# Patient Record
Sex: Female | Born: 1975 | Race: Black or African American | Hispanic: No | Smoking: Current every day smoker
Health system: Southern US, Community
[De-identification: ages and names within clinical notes are randomized; demographics above are authoritative.]

## PROBLEM LIST (undated history)

## (undated) DIAGNOSIS — I10 Essential (primary) hypertension: Secondary | ICD-10-CM

## (undated) DIAGNOSIS — F419 Anxiety disorder, unspecified: Secondary | ICD-10-CM

## (undated) DIAGNOSIS — E785 Hyperlipidemia, unspecified: Secondary | ICD-10-CM

---

## 2003-03-12 ENCOUNTER — Ambulatory Visit (HOSPITAL_COMMUNITY): Admission: RE | Admit: 2003-03-12 | Discharge: 2003-03-12 | Payer: Self-pay | Admitting: Family Medicine

## 2003-07-26 ENCOUNTER — Other Ambulatory Visit: Admission: RE | Admit: 2003-07-26 | Discharge: 2003-07-26 | Payer: Self-pay | Admitting: Family Medicine

## 2003-12-17 ENCOUNTER — Emergency Department (HOSPITAL_COMMUNITY): Admission: EM | Admit: 2003-12-17 | Discharge: 2003-12-17 | Payer: Self-pay | Admitting: Emergency Medicine

## 2003-12-21 ENCOUNTER — Emergency Department (HOSPITAL_COMMUNITY): Admission: EM | Admit: 2003-12-21 | Discharge: 2003-12-21 | Payer: Self-pay | Admitting: Emergency Medicine

## 2004-02-18 ENCOUNTER — Emergency Department (HOSPITAL_COMMUNITY): Admission: EM | Admit: 2004-02-18 | Discharge: 2004-02-18 | Payer: Self-pay | Admitting: Emergency Medicine

## 2004-10-02 ENCOUNTER — Other Ambulatory Visit: Admission: RE | Admit: 2004-10-02 | Discharge: 2004-10-02 | Payer: Self-pay | Admitting: Family Medicine

## 2005-03-25 ENCOUNTER — Inpatient Hospital Stay (HOSPITAL_COMMUNITY): Admission: EM | Admit: 2005-03-25 | Discharge: 2005-04-04 | Payer: Self-pay | Admitting: Emergency Medicine

## 2005-04-25 ENCOUNTER — Ambulatory Visit: Payer: Self-pay | Admitting: Internal Medicine

## 2005-05-07 ENCOUNTER — Ambulatory Visit: Payer: Self-pay | Admitting: Internal Medicine

## 2005-05-11 ENCOUNTER — Ambulatory Visit (HOSPITAL_COMMUNITY): Admission: RE | Admit: 2005-05-11 | Discharge: 2005-05-11 | Payer: Self-pay | Admitting: Internal Medicine

## 2005-05-15 ENCOUNTER — Ambulatory Visit: Payer: Self-pay | Admitting: Internal Medicine

## 2005-05-31 ENCOUNTER — Ambulatory Visit: Payer: Self-pay | Admitting: *Deleted

## 2005-06-19 ENCOUNTER — Ambulatory Visit: Payer: Self-pay | Admitting: Internal Medicine

## 2006-01-09 ENCOUNTER — Ambulatory Visit: Payer: Self-pay | Admitting: Internal Medicine

## 2006-01-21 ENCOUNTER — Ambulatory Visit: Payer: Self-pay | Admitting: Internal Medicine

## 2006-01-21 ENCOUNTER — Encounter (INDEPENDENT_AMBULATORY_CARE_PROVIDER_SITE_OTHER): Payer: Self-pay | Admitting: *Deleted

## 2006-11-22 ENCOUNTER — Other Ambulatory Visit: Admission: RE | Admit: 2006-11-22 | Discharge: 2006-11-22 | Payer: Self-pay | Admitting: Family Medicine

## 2007-03-20 IMAGING — CT CT PELVIS W/ CM
2 of 6 series · 16 of 46 positions shown, 18 images · IV contrast (omnipaque)
Comparison: 03/25/05.

CLINICAL DATA: Pancreatitis.  Perisplenic fluid present on ultrasound of 03/28/05 and also to small degree on the initial CT scan dated 03/24/05. 
 ABDOMEN CT WITH CONTRAST:
TECHNIQUE: Multidetector CT imaging of the abdomen was performed following the standard protocol during bolus administration of intravenous contrast.
 Contrast:  125 cc Omnipaque 300.  Oral and IV contrast were administered.
TECHNIQUE: Multidetector CT imaging of the pelvis was performed following the standard protocol during bolus administration of intravenous contrast.

[Series 2: abd_pel 5.0 b40f st · axial · 0.63mm/px · z∈[-110,+275]mm · 13 of 87 slices shown, 15 images]
[im 5/87  soft-tissue]
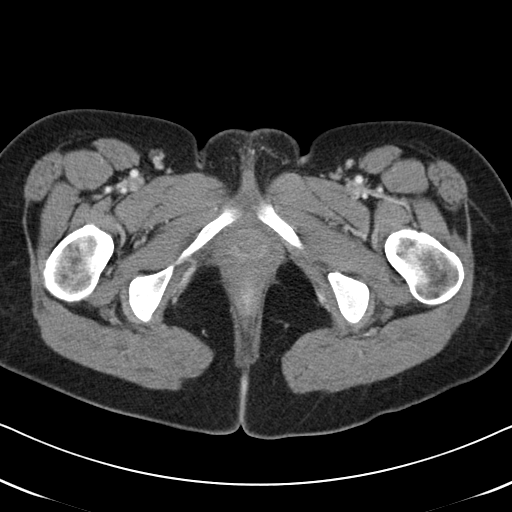
[im 5/87  bone]
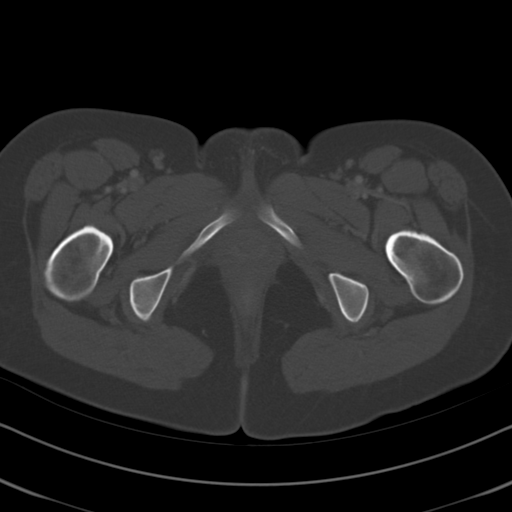
[im 10/87  soft-tissue]
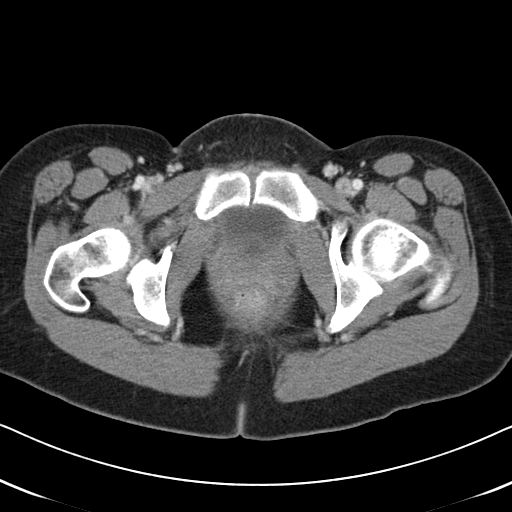
[im 20/87  soft-tissue]
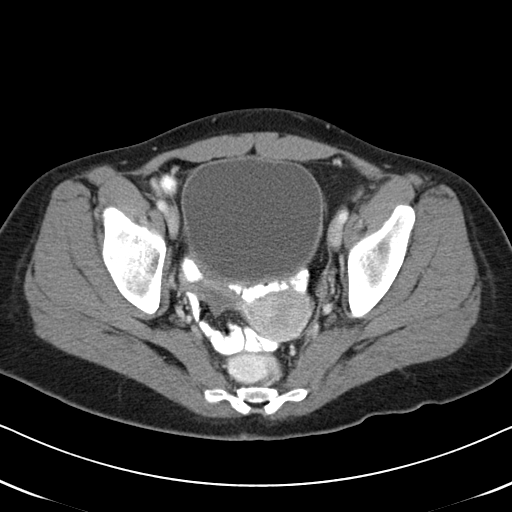
[im 24/87  soft-tissue]
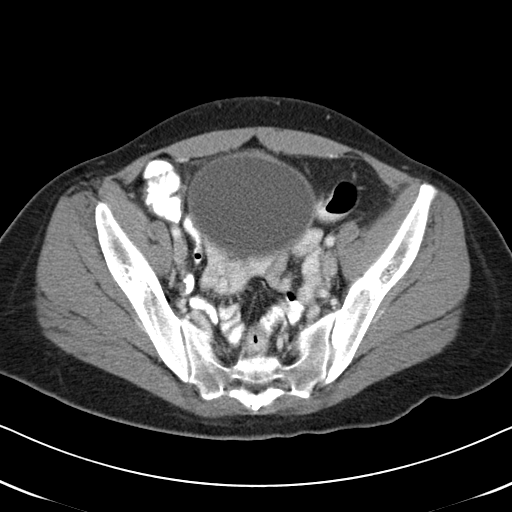
[im 29/87  soft-tissue]
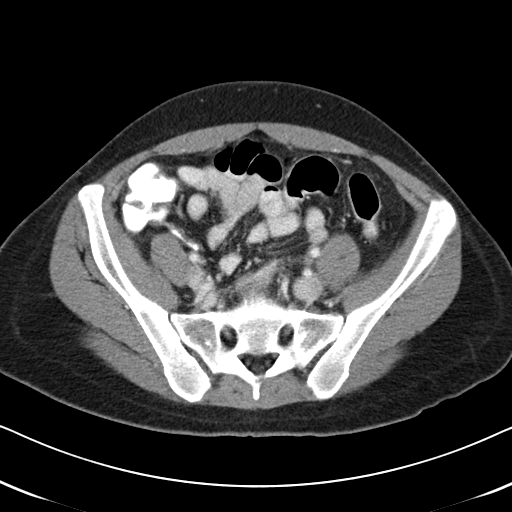
[im 39/87  soft-tissue]
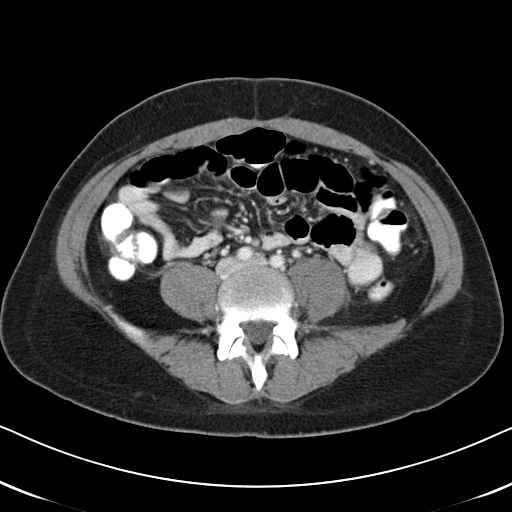
[im 44/87  soft-tissue]
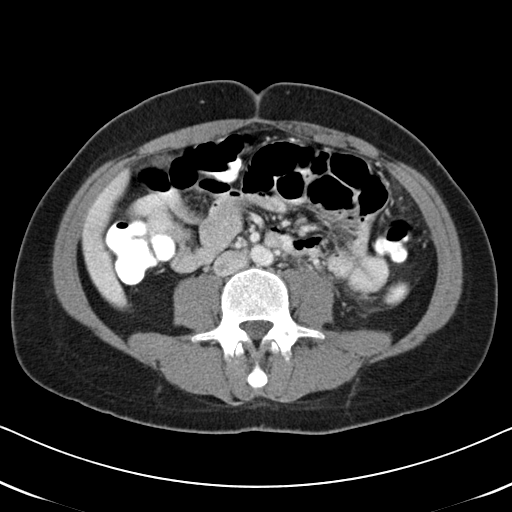
[im 48/87  soft-tissue]
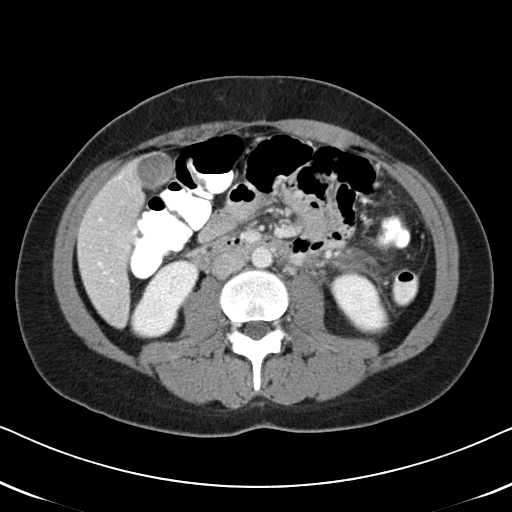
[im 58/87  soft-tissue]
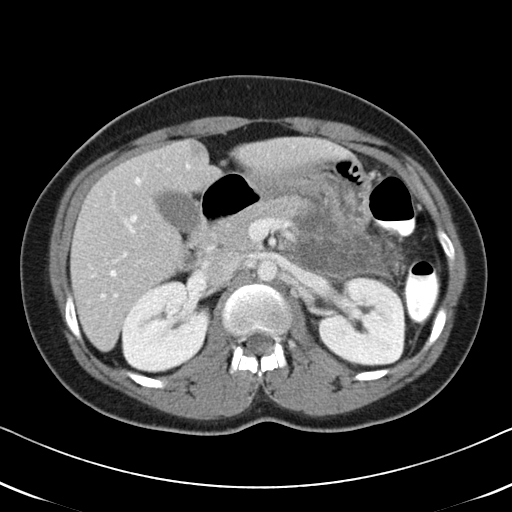
[im 58/87  bone]
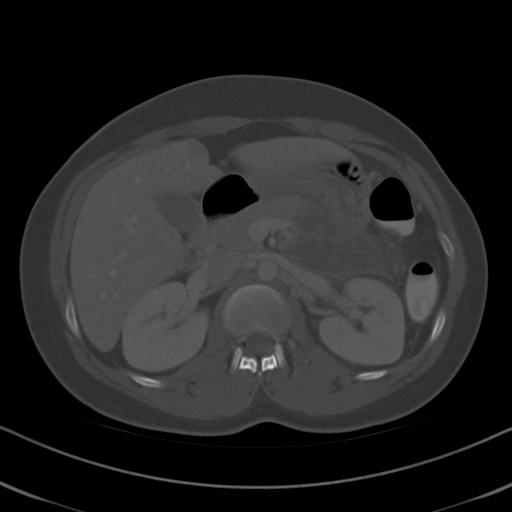
[im 63/87  soft-tissue]
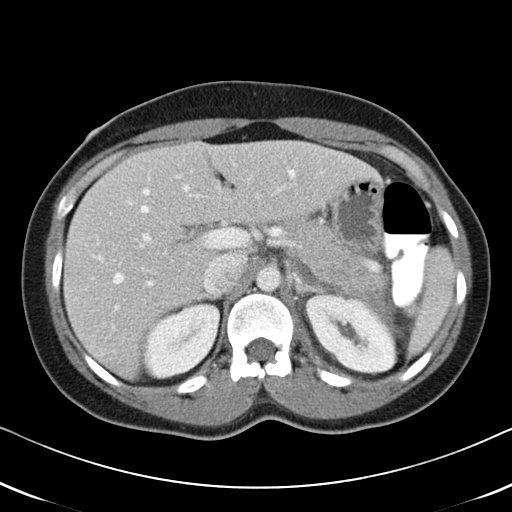
[im 67/87  soft-tissue]
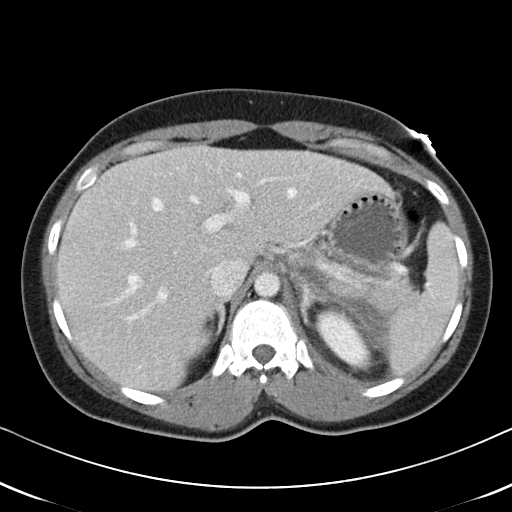
[im 77/87  soft-tissue]
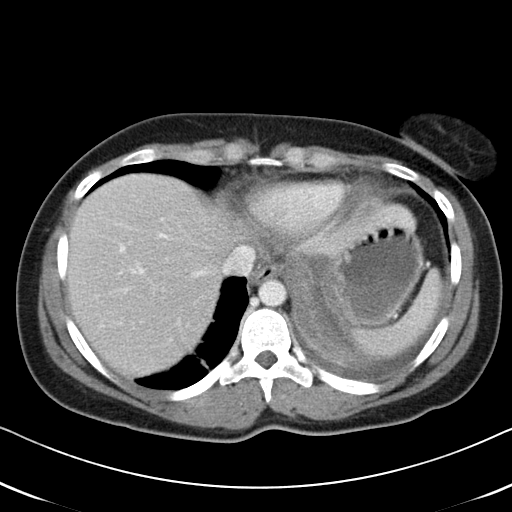
[im 82/87  soft-tissue]
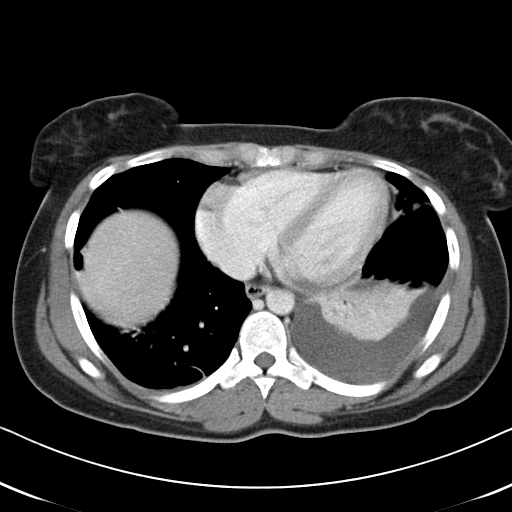

[Series 602: coronal · coronal · 0.88mm/px · 3 of 39 slices shown]
[im 13/39  soft-tissue]
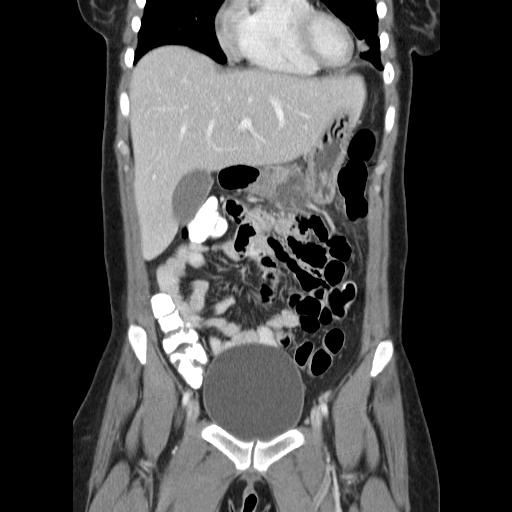
[im 17/39  soft-tissue]
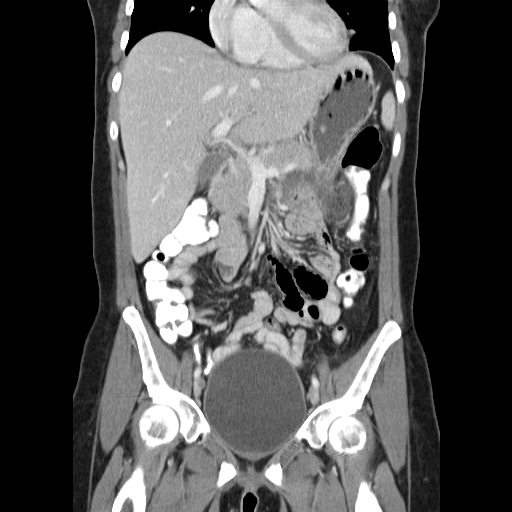
[im 22/39  soft-tissue]
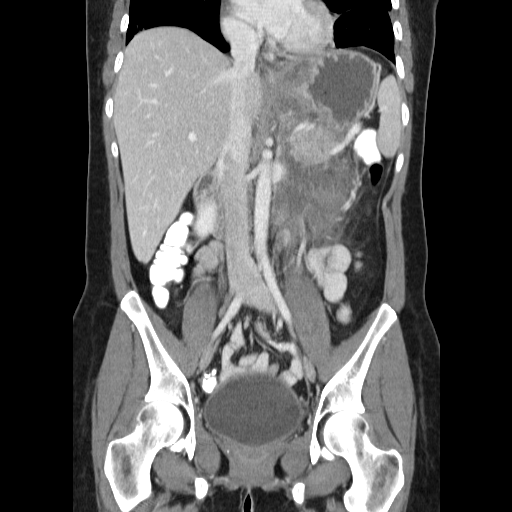

[16 of 46 positions shown; findings below may reference images not displayed]

FINDINGS: There is progression of pancreatitis with substantial inflammatory changes present beginning in the subphrenic space on the left and continuing to abut the entire tail and distal body of the pancreas.  Small loculations of peripancreatic fluid are present in this region and both inflammatory changes and small loculations of fluid continue over several centimeters inferior to the spleen in the retroperitoneal space.  The pancreas also demonstrates progressive diffuse edema since the prior CT beginning at the level of the proximal body and continuing throughout the tail.  A significant component of pancreatic necrosis is not present although there are some areas of low attenuation within the distal pancreas which may represent small focal areas of necrosis.  At this time none of the loculations of inflammatory fluid show evidence of abscess formation by CT.  
 No associated venous thrombosis is identified with normal patency demonstrated of the splenic vein and portal vein.  Mesenteric veins are also patent.  There is a new small left pleural effusion at the lung base with associated left lower lobe atelectasis.  
 No bowel obstruction.  No free air.  The liver, spleen, gallbladder adrenal glands, and kidneys continue to have a normal appearance.
IMPRESSION: Progression of pancreatitis with severe inflammatory changes seen surrounding the body and tail of the pancreas.  Extensive inflammatory change and small loculations of inflammatory fluid extend from the subphrenic space on the left and well inferior to the pancreas.  At this time no definite focal abscess is seen by CT.  There are some patchy areas of decreased enhancement in the tail of the pancreas potentially representing small areas of necrosis.  
 PELVIS CT WITH CONTRAST:
FINDINGS: No significant free fluid is seen in the pelvis.  Bladder, pelvic bowel loops, uterus, and adnexal regions are unremarkable.  No hernias.
IMPRESSION: Normal CT of the pelvis.

## 2007-11-28 ENCOUNTER — Other Ambulatory Visit: Admission: RE | Admit: 2007-11-28 | Discharge: 2007-11-28 | Payer: Self-pay | Admitting: Family Medicine

## 2009-03-16 ENCOUNTER — Other Ambulatory Visit: Admission: RE | Admit: 2009-03-16 | Discharge: 2009-03-16 | Payer: Self-pay | Admitting: Family Medicine

## 2010-05-24 ENCOUNTER — Other Ambulatory Visit (HOSPITAL_COMMUNITY)
Admission: RE | Admit: 2010-05-24 | Discharge: 2010-05-24 | Disposition: A | Discharge status: Home / Self Care | Payer: BC Managed Care – PPO | Source: Ambulatory Visit | Attending: Family Medicine | Admitting: Family Medicine

## 2010-05-24 ENCOUNTER — Other Ambulatory Visit: Payer: Self-pay | Admitting: Physician Assistant

## 2010-05-24 DIAGNOSIS — Z1159 Encounter for screening for other viral diseases: Secondary | ICD-10-CM | POA: Insufficient documentation

## 2010-05-24 DIAGNOSIS — Z0000 Encounter for general adult medical examination without abnormal findings: Secondary | ICD-10-CM | POA: Insufficient documentation

## 2010-06-30 NOTE — Consult Note (Signed)
[name], [name]                 ACCOUNT NO.:  [id number]   MEDICAL RECORD NO.:  [id number]          PATIENT TYPE:  INP   LOCATION:  1407                         FACILITY:  [facility]   PHYSICIAN:  Lebron Conners, M.D.   DATE OF BIRTH:  09/13/1975   DATE OF CONSULTATION:  03/25/2005  DATE OF DISCHARGE:                                   CONSULTATION   CHIEF COMPLAINT:  Abdominal pain.   HISTORY OF PRESENT ILLNESS:  [name] is a 35 year old black female who  enjoys good health who drinks alcohol fairly regularly and who is admitted  to the hospital because of abdominal pain which is also present in the back  and left shoulder. There was some nausea without vomiting. She felt she  needed to have a bowel movement and took several laxatives and had a lot of  diarrhea. She did not vomit. She has had a little bit of a cough and pain  with a deep breath. In the end, white count was a little elevated, lipase  was slightly elevated. There was a normal amylase,  bowel gas pattern was  unremarkable on x-ray and the patient had hyponatremia which was rather  marked with sodium of 116. A CT scan of the abdomen showed retrogastric and  left subphrenic inflammatory process without abscess or evidence of  perforation. This was thought possibly to be related to the tail of the  pancreas but the entire pancreas itself was not edematous or otherwise  normal. The patient was admitted to the hospital care of this problem and  has continued to have some pain and although she is hungry not feeling a  great deal better than when admitted. I am asked to see her to assist in  evaluation of this problem and to give advice regarding its management.   PAST MEDICAL HISTORY:  No serious chronic ailments. She is on birth control  pills but no other medicines and has no medicine allergies. She smokes  moderately.   Family history, childhood illnesses and detailed review of systems are  unremarkable.   PHYSICAL  EXAMINATION:  GENERAL:  A healthy appearing young black woman in no  acute distress.  VITAL SIGNS:  As reported on the computer are normal.  MENTAL STATUS:  Normal.  HEENT:  The head, neck, eyes, ears, nose, mouth and throat are unremarkable  with no cervical lymphadenopathy, no enlargement of the thyroid, no thyroid  mass. Moist mucous membranes. No scleral icterus.  CHEST:  The chest is clear to auscultation but there is complaint of pain  with a deep breath. There is left CVA tenderness to percussion.  HEART:  The heart rate is rapid, rhythm is regular and there is no murmur or  gallop noted.  BREASTS:  Symmetric and normal without masses.  ABDOMEN:  There is mild diffuse tenderness greatest in the left upper  quadrant. Bowel sounds are somewhat decreased. There is no hernia. There is  no mass.  EXTREMITIES:  No edema. Good pulses. No deformities or other lesions.  SKIN:  No lesions are noted.  LYMPH NODES:  Not enlarged in the axilla or neck.   IMPRESSION:  Abdominal pain and inflammatory process of uncertain etiology,  most likely mild pancreatitis. I feel that in the absence of history of  trauma, of vomiting and chronic epigastric pain that ulcer disease and  traumatic bleeding are quite unlikely. It is unlikely that biliary disease  is playing a part. The pancreatitis is most likely related to alcohol  intake.   RECOMMENDATIONS:  1.  Treatment of metabolic abnormalities.  2.  Bowel rest and provision of intravenous fluids.  3.  Repeat amylase and lipase.  4.  Serial physical examinations.  5.  After several days, I think she should have a repeat CT scan of the      abdomen.  6.  I see no reason to advise surgical intervention at this time.      Lebron Conners, M.D.  Electronically Signed     WB/MEDQ  D:  03/25/2005  T:  03/26/2005  Job:  [id number]   cc:   Sherin Quarry, MD

## 2010-06-30 NOTE — H&P (Signed)
NAMEVALARIA, KOHUT                 ACCOUNT NO.:  [id number]   MEDICAL RECORD NO.:  [id number]          PATIENT TYPE:  EMS   LOCATION:  ED                           [facility]ACILITY[facility]   PHYSICIAN:  Kela Millin, M.D.DATE OF BIRTH:  02-19-1975   DATE OF ADMISSION:  03/24/2005  DATE OF DISCHARGE:                                HISTORY & PHYSICAL   PRIMARY CARE PHYSICIAN:  Media planner, [employer]   CHIEF COMPLAINT:  Abdominal pain.   HISTORY OF PRESENT ILLNESS:  The patient is a 35 year old black female with  a history of alcohol use who presents with complaints of abdominal pain x2  days.  She states that the pain is all over her abdomen but greatest on the  left side, 9/10 in intensity, and radiating to her back and scapula.  It is  sharp, initially intermittent, but constant all day today.  She states that  for about 3 days she was constipated and thought that was what was causing  her abdominal pain, so today she took a bottle of magnesium citrate and had  multiple bowel movements following that, but her pain persisted.  She denies  nausea, vomiting, hematemesis, dysuria, melena, and no hematochezia.  She  admits to a nonproductive cough and pleuritic pain for the past few days but  denies fevers.   The patient was seen in the ER, and chest and abdominal films showed mild  bronchitic changes with nonspecific bowel gas pattern.  Her labs revealed  low sodium of 116 and elevated white cell count of 13.8.  A CT scan of the  abdomen was also abnormal, and the patient was admitted to the [facility] service for further evaluation and management.   PAST MEDICAL HISTORY:  As above.   MEDICATIONS:  Birth control pills.   ALLERGIES:  No known drug allergies.   SOCIAL HISTORY:  Positive for alcohol; she states that she drinks about 4 to  6 beers a couple of times a week as well as wine sometimes.  Positive for  tobacco x10 years.   FAMILY HISTORY:  Mother with  hypertension.   PHYSICAL EXAMINATION:  GENERAL: The patient is a young black female, alert  and oriented, appears anxious and uncomfortable secondary to abdominal pain.  VITAL SIGNS: Blood pressure 123/91, initially 128/101.  Temperature 97.8,  pulse 113, initially 129.  Respiratory rate 20, and O2 saturation 97%.  HEENT: PERRLA.  EOMI.  Dry mucous membranes, no oral exudates.  NECK:  Supple.  No adenopathy, no thyromegaly.  LUNGS: Clear to auscultation bilaterally. No crackles or wheezes.  CARDIOVASCULAR: Tachycardic, normal S1 and S2, regular rhythm.  ABDOMEN: Diffusely tenderness with greater tenderness in the left upper  quadrant.  No rebound tenderness.  No organomegaly and no masses palpable.  Bowel sounds present, mildly distended.  EXTREMITIES: No cyanosis or edema.  NEUROLOGIC:  Alert and oriented x3.  Cranial nerves II-XII grossly intact.  Nonfocal exam.   LABORATORY DATA:  CT scan of the abdomen shows left diaphragmatic  inflammatory process, question marked pancreatitis of pancreatic tail. Also  observed changes continuous with posterior wall of the stomach and extends  to the gastroesophageal junction.  No discrete gastric thickening seen.  Cannot exclude ulcer disease.   Abdominal and chest x-ray: Mild bronchitic changes, nonspecific bowel gas  pattern.   Urinalysis: Yellow, clear, specific gravity 1.030, pH of 5, urine nitrite  negative, leukocyte esterase negative.  Urine wbc's 0 to 2. White cell count  13.8, hemoglobin 14.7, hematocrit 41.8, platelet count 211, neutrophil count  85%.  Sodium 116, potassium 3.5, chloride 87, CO2 18, glucose 102, BUN 7,  creatinine 0.3, calcium 8.7.   ASSESSMENT AND PLAN:  1.  Abdominal pain, probable pancreatitis, abdominal CT also with      inflammatory changes continuous with posterior wall of stomach extending      to the gastroesophageal junction.  I cannot rule out ulcer disease.  As      already discussed above, the patient  presenting with predominantly left      upper quadrant pain.  History is positive for alcohol abuse and CT      findings as above.  I will check a serum amylase and lipase.  I will      keep the patient n.p.o., hydrate with IV fluids, IV narcotics for pain      management, also start IV Protonix.  The patient counseled to quit      alcohol.  2.  Hyponatremia, likely secondary to volume depletion (a patient with      multiple fall status post magnesium citrate).  Also, a patient with      history of alcohol abuse and potomania as possible etiology as well.  I      will check TSH and serum cortisol as well as urine sodium and      osmolality.  I will hydrate with normal saline, monitor serum sodium q.4      h. and follow.  Patient with no nausea, vomiting, or altered mental      status.  3.  Alcohol abuse: I will start IV multivitamins, thiamine, and Ativan,      monitor with withdrawal.  Patient counseled to quit alcohol.  4.  Tobacco abuse.      Kela Millin, M.D.  Electronically Signed     ACV/MEDQ  D:  03/25/2005  T:  03/25/2005  Job:  [id number]   cc:   [facility], [facility]

## 2010-06-30 NOTE — Discharge Summary (Signed)
NAMELUREE, [name]                 ACCOUNT NO.:  [id number]   MEDICAL RECORD NO.:  [id number]          PATIENT TYPE:  INP   LOCATION:  1407                         FACILITY:  [facility]   PHYSICIAN:  Hollice Espy, M.D.DATE OF BIRTH:  [birth year]   DATE OF ADMISSION:  03/24/2005  DATE OF DISCHARGE:  04/04/2005                                 DISCHARGE SUMMARY   CONSULTATIONS:  Lebron Conners, M.D., surgery.   DISCHARGE DIAGNOSES:  1.  Acute pancreatitis, now resolved.  2.  Excessive alcohol use causing #1.   DISCHARGE MEDICATIONS:  Patient was to resume her normal meds-Birth control.  She will also be discharged on Dilaudid 2 mg 1 p.o. q.4-6h., total #10.   FOLLOW-UP APPOINTMENTS:  Patient will follow up as needed with [facility].  She has also been given contact information for alcohol and drug  services.   DISCHARGE DIET:  Regular.   ACTIVITY:  As tolerated.   DISPOSITION:  Improved.  Patient is being discharged to home.   HOSPITAL COURSE:  The patient is a 35 year old African-American female with  no past medical history, who presented on March 25, 2005 after several  days of nausea, vomiting, and mid epigastric pain.  She was suspected to  have pancreatitis.  Her initial amylase and lipase were not very remarkable;  however, following administration of IV fluids and made n.p.o., she actually  increased her amylase and lipase.  This persisted over several days, and she  actually ended up requiring TPN.  Surgery was consulted, who recommended  that no evidence of any other intervention was necessary.  Initially, there  was a question as to the cause of the etiology of the patient's  pancreatitis.  She admitted to some alcohol use but felt it was not  excessive; however, she later confided in her physicians that she had been  at times known to drink heavily, or binge drink.  CT scan showed no evidence  of any gallbladder dysfunction.  A lipid panel was ordered  which showed no  evidence of any hypertriglyceridemia.  At this point, it was felt that the  alcohol use was the sole cause of her pancreatitis.  The patient over the  next several days continued to improve.  She was started on a clear-liquid  diet on April 02, 2005, which she tolerated well.  This was advanced very  slowly to a full-liquid diet, and by February 21, she was tolerating a  regular solid food diet, at which point she is felt to be medically stable.  She had a few episodes of minimal abdominal pain but nothing, she said, that  she could not tolerate.   The plan will be for the patient to be discharged home and follow up.  She  currently has no insurance.  She will be given a referral for [facility].      Hollice Espy, M.D.  Electronically Signed     SKK/MEDQ  D:  04/04/2005  T:  04/05/2005  Job:  [id number]

## 2011-02-28 ENCOUNTER — Emergency Department (HOSPITAL_COMMUNITY)
Admission: EM | Admit: 2011-02-28 | Discharge: 2011-03-01 | Disposition: A | Discharge status: Home / Self Care | Payer: BC Managed Care – PPO | Attending: Emergency Medicine | Admitting: Emergency Medicine

## 2011-02-28 ENCOUNTER — Encounter (HOSPITAL_COMMUNITY): Payer: Self-pay

## 2011-02-28 ENCOUNTER — Emergency Department (HOSPITAL_COMMUNITY): Payer: BC Managed Care – PPO

## 2011-02-28 ENCOUNTER — Other Ambulatory Visit: Payer: Self-pay

## 2011-02-28 DIAGNOSIS — R079 Chest pain, unspecified: Secondary | ICD-10-CM | POA: Insufficient documentation

## 2011-02-28 DIAGNOSIS — R000 Tachycardia, unspecified: Secondary | ICD-10-CM | POA: Insufficient documentation

## 2011-02-28 HISTORY — DX: Anxiety disorder, unspecified: F41.9

## 2011-02-28 LAB — CBC
HCT: 39.6 % (ref 36.0–46.0)
Hemoglobin: 13.8 g/dL (ref 12.0–15.0)
MCH: 31.5 pg (ref 26.0–34.0)
MCHC: 34.8 g/dL (ref 30.0–36.0)
MCV: 90.4 fL (ref 78.0–100.0)
Platelets: 234 10*3/uL (ref 150–400)
RBC: 4.38 MIL/uL (ref 3.87–5.11)
RDW: 12.5 % (ref 11.5–15.5)
WBC: 6 10*3/uL (ref 4.0–10.5)

## 2011-02-28 LAB — POCT I-STAT TROPONIN I: Troponin i, poc: 0 ng/mL (ref 0.00–0.08)

## 2011-02-28 LAB — BASIC METABOLIC PANEL
BUN: 12 mg/dL (ref 6–23)
CO2: 21 mEq/L (ref 19–32)
Calcium: 9.4 mg/dL (ref 8.4–10.5)
Chloride: 106 mEq/L (ref 96–112)
Creatinine, Ser: 0.62 mg/dL (ref 0.50–1.10)
GFR calc Af Amer: >90 mL/min (ref >90)
GFR calc non Af Amer: >90 mL/min (ref >90)
Glucose, Bld: 120 mg/dL — ABNORMAL HIGH (ref 70–99)
Potassium: 3.9 mEq/L (ref 3.5–5.1)
Sodium: 137 mEq/L (ref 135–145)

## 2011-02-28 LAB — D-DIMER, QUANTITATIVE: D-Dimer, Quant: 0.28 ug/mL-FEU (ref 0.00–0.48)

## 2011-02-28 MED ORDER — ASPIRIN 81 MG PO CHEW
324.0000 mg | CHEWABLE_TABLET | Freq: Once | ORAL | Status: AC
  Administered 2011-02-28: 324 mg via ORAL
  Filled 2011-02-28: qty 4

## 2011-02-28 NOTE — ED Notes (Signed)
Pt complains of chest tightness, left arm pain and back pain for one hour

## 2011-02-28 NOTE — ED Notes (Signed)
Patient transported to X-ray 

## 2011-02-28 NOTE — ED Notes (Signed)
Pt is C/O substernal chest pressure with irregular heart rate. Pt states pressure radiates to left arm Denies SOB, N&V.

## 2011-03-01 NOTE — ED Provider Notes (Signed)
History    36 year-old female with chest pain. Patient with tightness in her chest with radiation up into her left arm. Constant since onset. Feels like her heart is skipping beats. No shortness of breath. No back pain. Does not feel nauseated. No unusual leg pain or swelling. Denies history of blood clots. Her recent surgery or prolonged immobilization. No history of cancer. Patient denies history of hypertension or diabetes. She does smoke. Takes diazepam as needed for anxiety otherwise no medications. CSN: [id number]  Arrival date & time 02/28/11  2147   First MD Initiated Contact with Patient 02/28/11 2225      Chief Complaint  Patient presents with  . Chest Pain  . Palpitations    (Consider location/radiation/quality/duration/timing/severity/associated sxs/prior treatment) HPI  Past Medical History  Diagnosis Date  . Anxiety     History reviewed. No pertinent past surgical history.  History reviewed. No pertinent family history.  History  Substance Use Topics  . Smoking status: Not on file  . Smokeless tobacco: Not on file  . Alcohol Use: No    OB History    Grav Para Term Preterm Abortions TAB SAB Ect Mult Living                  Review of Systems   Review of symptoms negative unless otherwise noted in HPI.   Allergies  Review of patient's allergies indicates no known allergies.  Home Medications   Current Outpatient Rx  Name Route Sig Dispense Refill  . DIAZEPAM 10 MG PO TABS Oral Take 5 mg by mouth every 6 (six) hours as needed.       BP 147/91  Pulse 102  Temp(Src) 98.4 F (36.9 C) (Oral)  Resp 20  SpO2 99%  Physical Exam  Nursing note and vitals reviewed. Constitutional: She appears well-developed and well-nourished. No distress.       Sitting up in bed. No acute distress. Mildly anxious appearing.  HENT:  Head: Normocephalic and atraumatic.  Eyes: Conjunctivae are normal. Pupils are equal, round, and reactive to light. Right eye exhibits  no discharge. Left eye exhibits no discharge.  Neck: Normal range of motion. Neck supple.  Cardiovascular: Regular rhythm and normal heart sounds.  Exam reveals no gallop and no friction rub.   No murmur heard.      Mild tachycardia. No murmur.  Pulmonary/Chest: Effort normal and breath sounds normal. No respiratory distress.  Abdominal: Soft. She exhibits no distension. There is no tenderness.  Musculoskeletal: Normal range of motion. She exhibits no edema and no tenderness.       Chest and shoulder pain not reproducible. No overlying skin lesions.  Neurological: She is alert.  Skin: Skin is warm and dry. She is not diaphoretic.  Psychiatric: Her behavior is normal. Thought content normal.      ED Course  Procedures (including critical care time)  Labs Reviewed  BASIC METABOLIC PANEL - Abnormal; Notable for the following:    Glucose, Bld 120 (*)    All other components within normal limits  CBC  D-DIMER, QUANTITATIVE  POCT I-STAT TROPONIN I  LAB REPORT - SCANNED  I-STAT TROPONIN I   Dg Chest 2 View  02/28/2011  *RADIOLOGY REPORT*  Clinical Data: Chest pain  CHEST - 2 VIEW  Comparison:  03/29/2005  Findings:  The heart size and mediastinal contours are within normal limits.  Both lungs are clear.  The visualized skeletal structures are unremarkable.  IMPRESSION: No active cardiopulmonary disease.  Original Report  Authenticated By: Judie Petit. Ruel Favors, M.D.   EKG:  Rhythm: normal sinus with PACs Rate: 88 Axis: normal Intervals: QTc slightly prolonged at 484 ms ST segments: normal    1. Chest pain       MDM  36 year-old female with chest pain and palpitations. Consider ACS,  PE, infectious, musculoskeletal, gastroesophageal reflux, pneumothorax, anxiety. Feel very low risk for ACS. Patient's EKG without ischemic changes. Troponin was within normal limits. Patient has no significant risk factors for CAD aside from smoking. Patient counseled on the harmful effects of tobacco  encouraged to quit. . Chest x-ray with no focal findings. Afebrile. Symptoms not typical for GERD. Low suspicion for PE patient also has a normal d-dimer. Suspect at least some component of anxiety. Although patient did not directly admitted to me she did feel more comfortable speaking with nursing concerning a loved one with some cognitive issues Strict return precautions discussed. Outpatient followup to discuss stress testing. Feel that this can be safely and properly done as an outpatient. Patient instructed that she should try to arrange this for the next few days to approximately a week.        Raeford Razor, MD 03/06/11 1505

## 2011-03-01 NOTE — ED Notes (Signed)
Pt was provided with a list of resources for Alzheimer care givers.

## 2012-07-24 ENCOUNTER — Other Ambulatory Visit: Payer: Self-pay | Admitting: Physician Assistant

## 2012-07-24 ENCOUNTER — Other Ambulatory Visit (HOSPITAL_COMMUNITY)
Admission: RE | Admit: 2012-07-24 | Discharge: 2012-07-24 | Disposition: A | Discharge status: Home / Self Care | Payer: BC Managed Care – PPO | Source: Ambulatory Visit | Attending: Family Medicine | Admitting: Family Medicine

## 2012-07-24 DIAGNOSIS — Z0000 Encounter for general adult medical examination without abnormal findings: Secondary | ICD-10-CM | POA: Insufficient documentation

## 2013-02-18 IMAGING — CR DG CHEST 2V
2 series · 2 of 2 positions shown · non-contrast
Comparison: 03/29/2005

CLINICAL DATA: Chest pain

CHEST - 2 VIEW

[w chest pa]
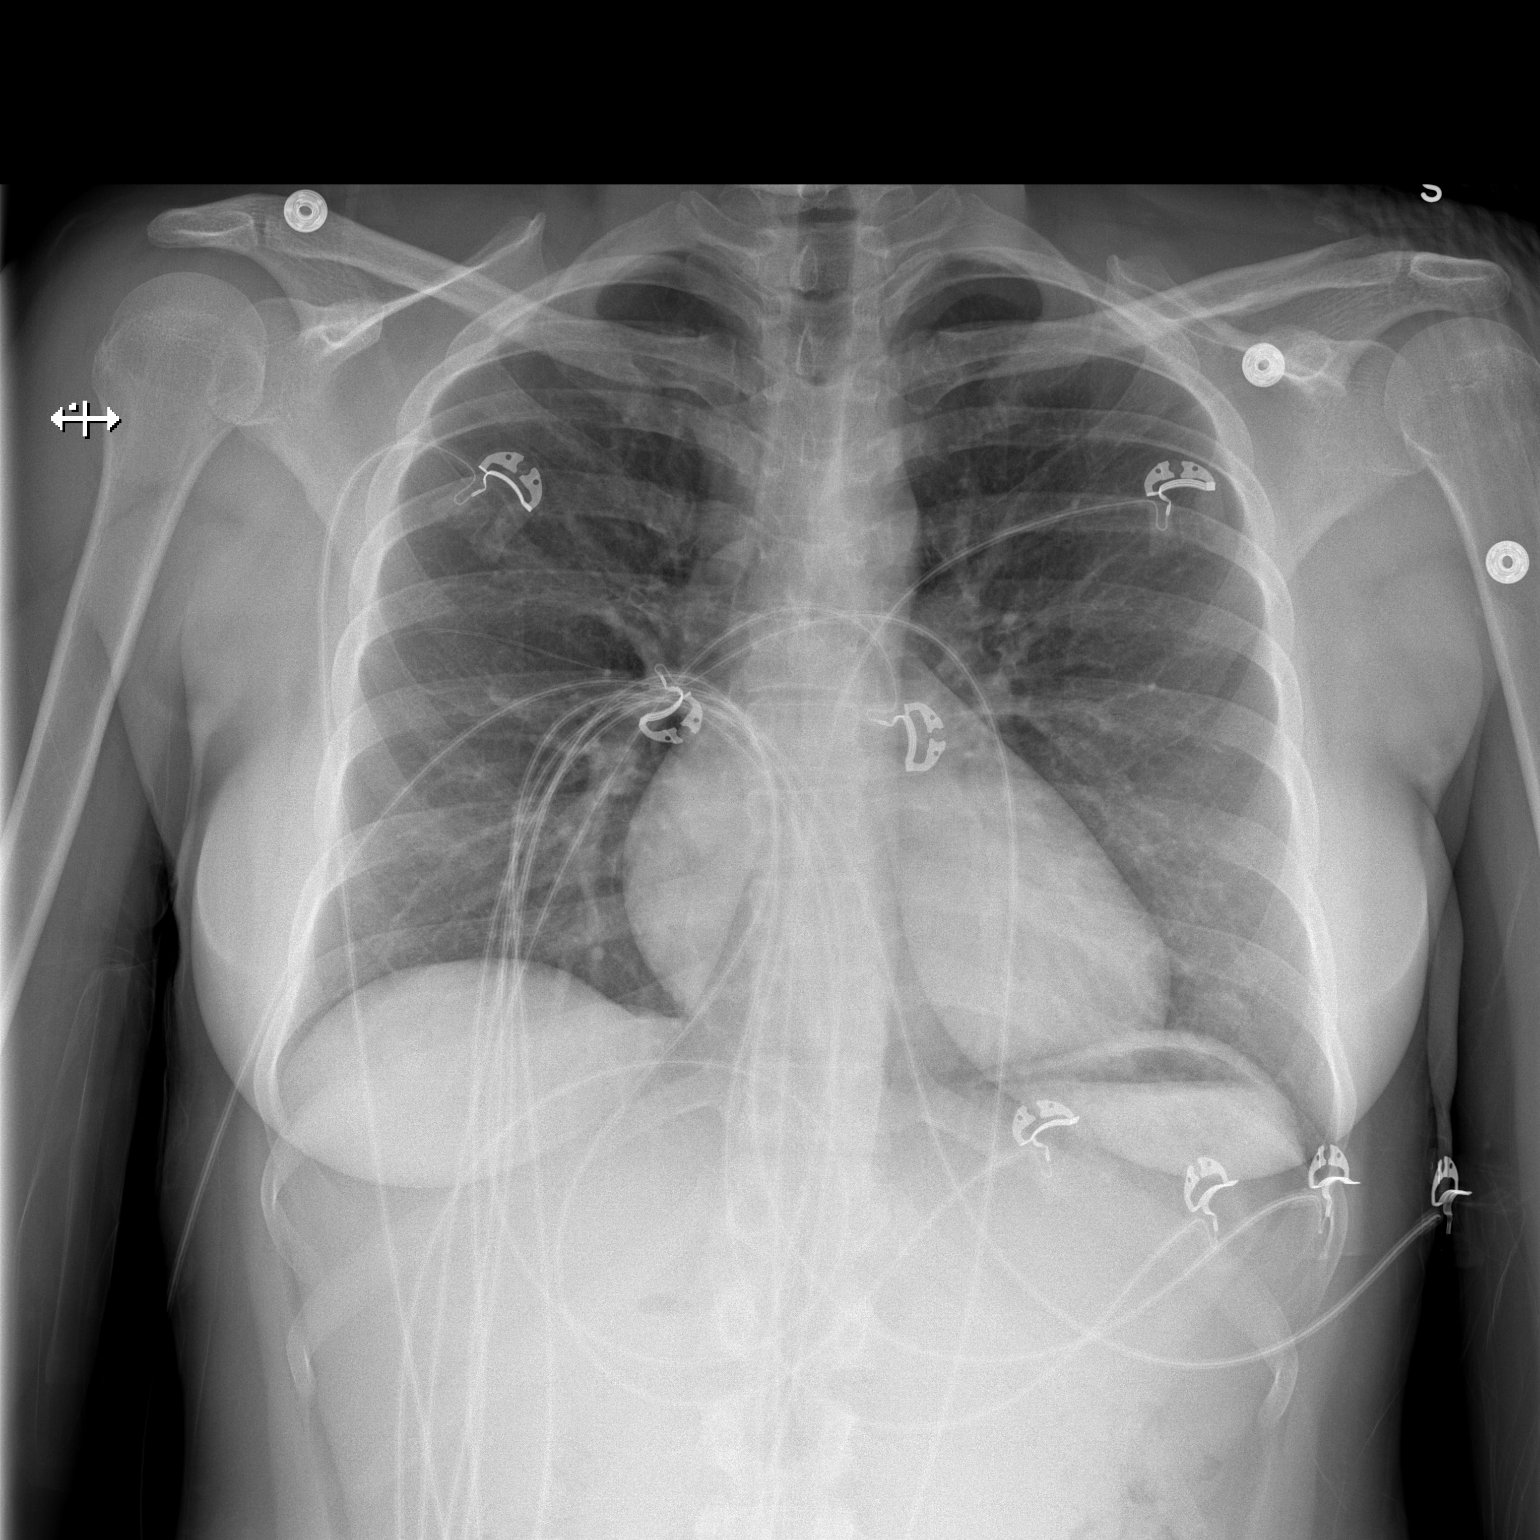

[w chest lat]
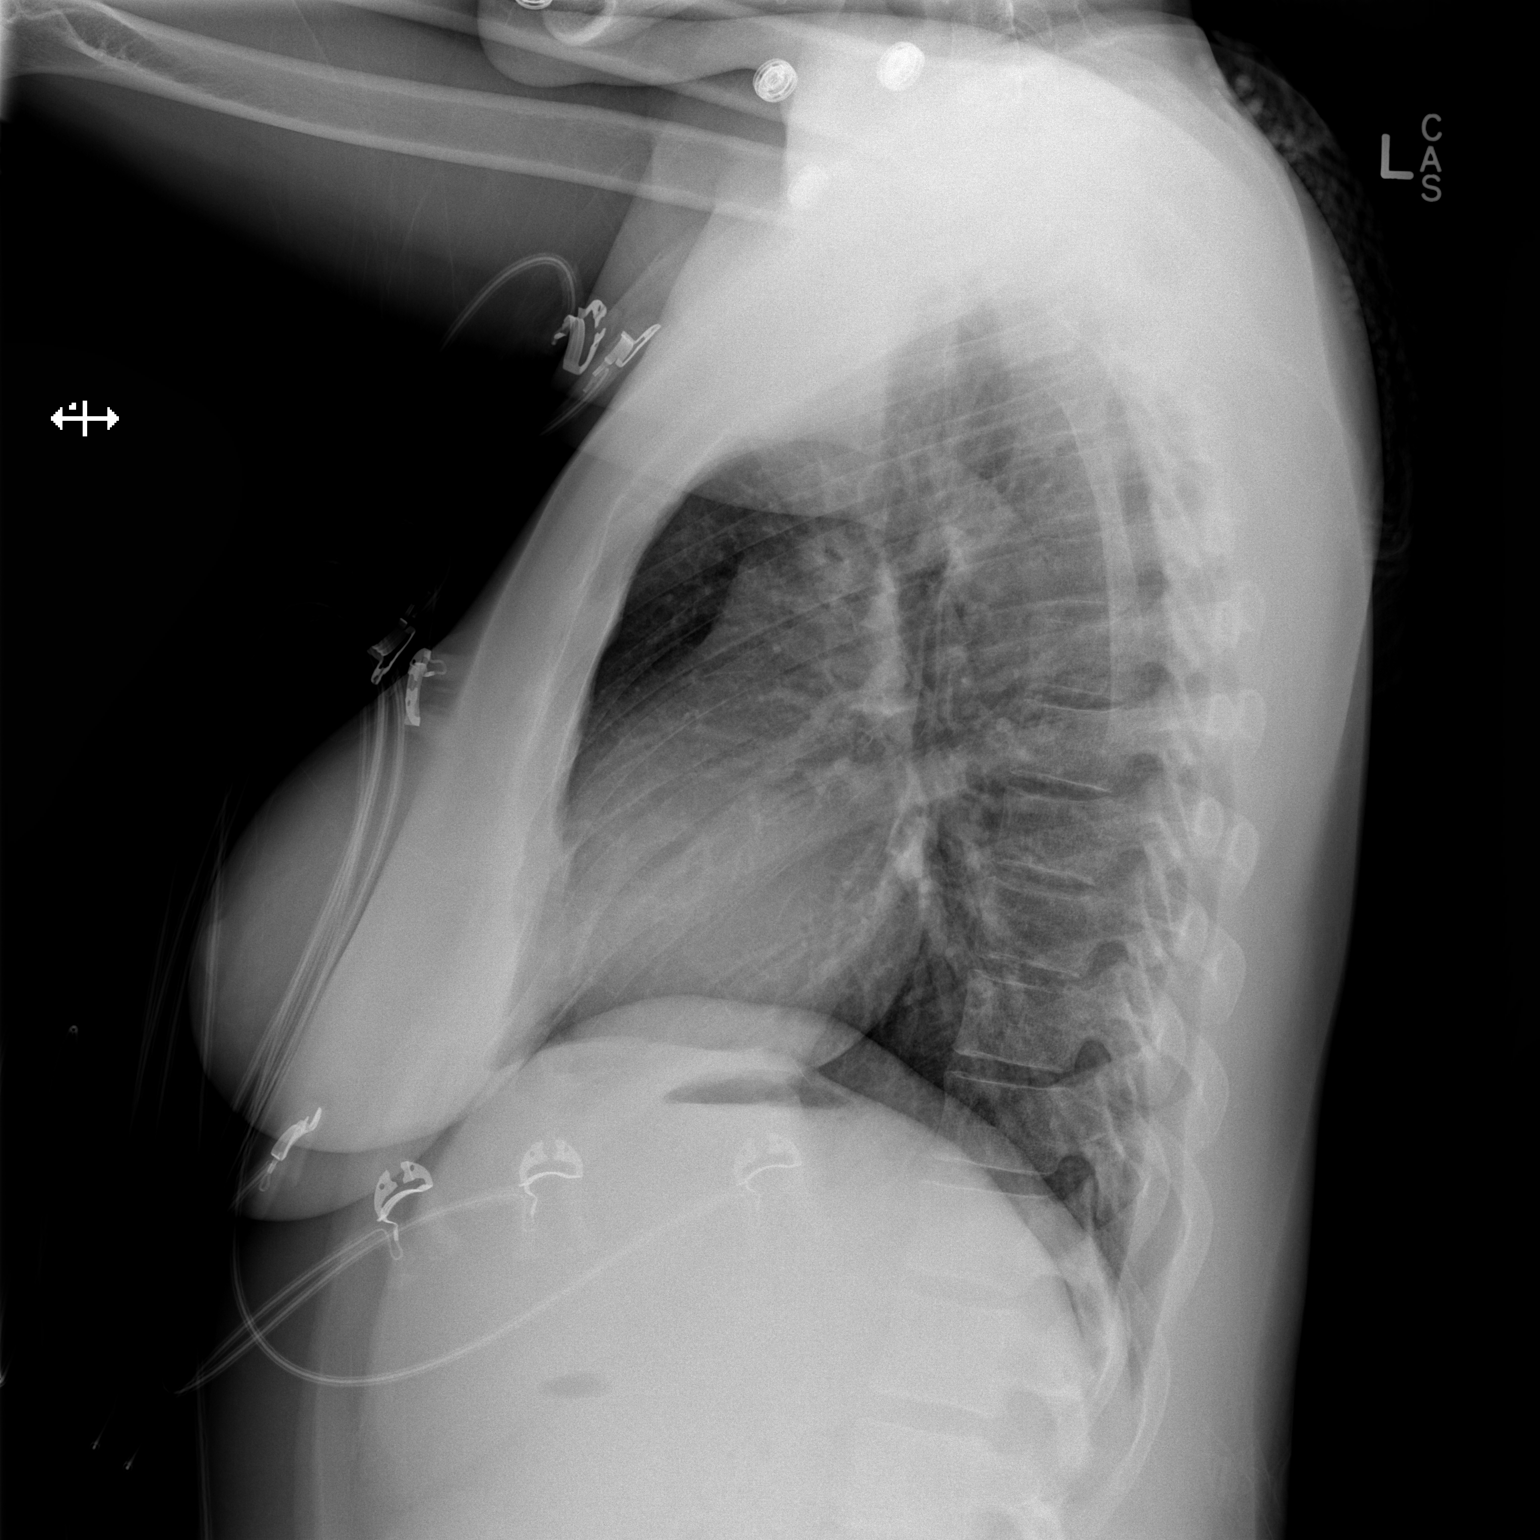

[2 of 2 positions shown; findings below may reference images not displayed]

FINDINGS: The heart size and mediastinal contours are within
normal limits.  Both lungs are clear.  The visualized skeletal
structures are unremarkable.
IMPRESSION: No active cardiopulmonary disease.

## 2018-12-02 ENCOUNTER — Other Ambulatory Visit: Payer: Self-pay

## 2018-12-02 ENCOUNTER — Encounter (HOSPITAL_COMMUNITY): Payer: Self-pay | Admitting: Emergency Medicine

## 2018-12-02 ENCOUNTER — Emergency Department (HOSPITAL_COMMUNITY)
Admission: EM | Admit: 2018-12-02 | Discharge: 2018-12-02 | Disposition: A | Discharge status: Home / Self Care | Payer: No Typology Code available for payment source | Attending: Emergency Medicine | Admitting: Emergency Medicine

## 2018-12-02 ENCOUNTER — Emergency Department (HOSPITAL_COMMUNITY): Payer: No Typology Code available for payment source

## 2018-12-02 DIAGNOSIS — R079 Chest pain, unspecified: Secondary | ICD-10-CM | POA: Insufficient documentation

## 2018-12-02 DIAGNOSIS — Z5321 Procedure and treatment not carried out due to patient leaving prior to being seen by health care provider: Secondary | ICD-10-CM | POA: Diagnosis not present

## 2018-12-02 LAB — BASIC METABOLIC PANEL
Anion gap: 13 (ref 5–15)
BUN: 10 mg/dL (ref 6–20)
CO2: 25 mmol/L (ref 22–32)
Calcium: 8.9 mg/dL (ref 8.9–10.3)
Chloride: 97 mmol/L — ABNORMAL LOW (ref 98–111)
Creatinine, Ser: 0.62 mg/dL (ref 0.44–1.00)
GFR calc Af Amer: >60 mL/min (ref >60)
GFR calc non Af Amer: >60 mL/min (ref >60)
Glucose, Bld: 95 mg/dL (ref 70–99)
Potassium: 3.4 mmol/L — ABNORMAL LOW (ref 3.5–5.1)
Sodium: 135 mmol/L (ref 135–145)

## 2018-12-02 LAB — CBC
HCT: 41.6 % (ref 36.0–46.0)
Hemoglobin: 13.6 g/dL (ref 12.0–15.0)
MCH: 31.8 pg (ref 26.0–34.0)
MCHC: 32.7 g/dL (ref 30.0–36.0)
MCV: 97.2 fL (ref 80.0–100.0)
Platelets: 239 10*3/uL (ref 150–400)
RBC: 4.28 MIL/uL (ref 3.87–5.11)
RDW: 13.2 % (ref 11.5–15.5)
WBC: 4.8 10*3/uL (ref 4.0–10.5)
nRBC: 0 % (ref 0.0–0.2)

## 2018-12-02 LAB — I-STAT BETA HCG BLOOD, ED (NOT ORDERABLE): I-stat hCG, quantitative: <5 m[IU]/mL (ref <5)

## 2018-12-02 LAB — TROPONIN I (HIGH SENSITIVITY): Troponin I (High Sensitivity): <2 ng/L (ref <18)

## 2018-12-02 NOTE — ED Triage Notes (Signed)
Per pt, states she was doing some school work on her computer-states she started having chest pain-states both hands became shaky-states she has a history of panic attacks-took took of her diazepam

## 2019-01-15 ENCOUNTER — Ambulatory Visit (HOSPITAL_COMMUNITY): Payer: No Typology Code available for payment source | Admitting: Psychiatry

## 2019-01-15 ENCOUNTER — Other Ambulatory Visit: Payer: Self-pay

## 2019-01-27 ENCOUNTER — Other Ambulatory Visit: Payer: Self-pay

## 2019-01-27 ENCOUNTER — Encounter (HOSPITAL_COMMUNITY): Payer: Self-pay | Admitting: Psychiatry

## 2019-01-27 ENCOUNTER — Ambulatory Visit (INDEPENDENT_AMBULATORY_CARE_PROVIDER_SITE_OTHER): Payer: No Typology Code available for payment source | Admitting: Psychiatry

## 2019-01-27 DIAGNOSIS — F410 Panic disorder [episodic paroxysmal anxiety] without agoraphobia: Secondary | ICD-10-CM

## 2019-01-27 MED ORDER — DIAZEPAM 5 MG PO TABS: 5.0000 mg | ORAL_TABLET | Freq: Two times a day (BID) | ORAL | 2 refills | Status: AC | PRN

## 2019-01-27 NOTE — Progress Notes (Signed)
Psychiatric Initial Adult Assessment   Patient Identification: [name] MRN:  [id number] Date of Evaluation:  01/27/2019 [facility] Chief Complaint:   Chief Complaint    Anxiety; Panic Attack; Establish Care    Interview was conducted using WebEx teleconferencing application and I verified that I was speaking with the correct person using two identifiers. I discussed the limitations of evaluation and management by telemedicine and  the availability of in person appointments. Patient expressed understanding and agreed to proceed.  Visit Diagnosis:    ICD-10-CM   1. Panic disorder  F41.0     History of Present Illness:  43 yo divorced AAF who comes to establish care hoping to continue medications for episodic anxiety and insomnia. [place] and has been dealing with panic type anxiety for "years". She was tried on alprazolam and  What sounds like antidepressants but did not respond to the former and did not wish to take medications on daily basis as her anxiety attacks do not occur daily. She has eventually settled on as needed diazepam 5 mg which worked well on as needed basis. She has been on this medication at least since 2013. This year has been particularly difficult for her: she had three deaths in her family; she is the sole caregiver for her mother who has Alzheimer's disease. Pryor Montesakia also is trying to complete her education at [employer] at the same time. She reports that frequency of panic attacks varies but they have been occurring weekly (not daily). She has been worrying a lot lately and this makes falling asleep more difficult and she would use diazepam for that as well. There is no hx of clinical depression, mania, psychosis. She does not abuse alcohol or street drugs. There is no hx of abuse/trauma.  There is no psychiatric or substance abuse hx in her family. Medical hx: recently diagnosed hypertension (on amlodipine 2.5 mg  daily.    Associated Signs/Symptoms: Depression Symptoms:  insomnia, panic attacks, (Hypo) Manic Symptoms:  None Anxiety Symptoms:  Panic Symptoms, Psychotic Symptoms:  None PTSD Symptoms: Negative  Past Psychiatric History: See above.  Previous Psychotropic Medications: Yes   Substance Abuse History in the last 12 months:  No.  Consequences of Substance Abuse: NA  Past Medical History:  Past Medical History:  Diagnosis Date  . Anxiety    No past surgical history on file.  Family Psychiatric History: None.  Family History: No family history on file.  Social History:   Social History   Socioeconomic History  . Marital status: Divorced    Spouse name: Not on file  . Number of children: 0  . Years of education: Not on file  . Highest education level: Not on file  Occupational History  . Occupation: Consulting civil engineerstudent  Tobacco Use  . Smoking status: Current Every Day Smoker    Packs/day: 1.50    Types: Cigarettes  . Smokeless tobacco: Never Used  Substance and Sexual Activity  . Alcohol use: No  . Drug use: Never  . Sexual activity: Not on file  Other Topics Concern  . Not on file  Social History Narrative  . Not on file   Social Determinants of Health   Financial Resource Strain:   . Difficulty of Paying Living Expenses: Not on file  Food Insecurity:   . Worried About Programme researcher, broadcasting/film/videounning Out of Food in the Last Year: Not on file  . Ran Out of Food in the Last Year: Not on  file  Transportation Needs:   . Film/video editor (Medical): Not on file  . Lack of Transportation (Non-Medical): Not on file  Physical Activity:   . Days of Exercise per Week: Not on file  . Minutes of Exercise per Session: Not on file  Stress:   . Feeling of Stress : Not on file  Social Connections:   . Frequency of Communication with Friends and Family: Not on file  . Frequency of Social Gatherings with Friends and Family: Not on file  . Attends Religious Services: Not on file  . Active  Member of Clubs or Organizations: Not on file  . Attends Archivist Meetings: Not on file  . Marital Status: Not on file    Additional Social History: Divorced (married once), no children. She grew up in [place], raised by her mother. She has 5 brothers and a sister. 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 She now lives with her mother who is suffering from dementia and is taking care of her.  Allergies:  No Known Allergies  Metabolic Disorder Labs: No results found for: HGBA1C, MPG No results found for: PROLACTIN No results found for: CHOL, TRIG, HDL, CHOLHDL, VLDL, LDLCALC No results found for: TSH  Therapeutic Level Labs: No results found for: LITHIUM No results found for: CBMZ No results found for: VALPROATE  Current Medications: Current Outpatient Medications  Medication Sig Dispense Refill  . diazepam (VALIUM) 5 MG tablet Take 1 tablet (5 mg total) by mouth 2 (two) times daily as needed for anxiety or sleep. 60 tablet 2   No current facility-administered medications for this visit.    Psychiatric Specialty Exam: Review of Systems  Psychiatric/Behavioral: Positive for sleep disturbance.  All other systems reviewed and are negative.   There were no vitals taken for this visit.There is no height or weight on file to calculate BMI.  General Appearance: Casual and Well Groomed  Eye Contact:  Good  Speech:  Clear and Coherent and Normal Rate  Volume:  Normal  Mood:  Anxious  Affect:  Full Range  Thought Process:  Goal Directed and Linear  Orientation:  Full (Time, Place, and Person)  Thought Content:  Logical  Suicidal Thoughts:  No  Homicidal Thoughts:  No  Memory:  Immediate;   Good Recent;   Good Remote;   Good  Judgement:  Good  Insight:  Good  Psychomotor Activity:  Normal  Concentration:  Concentration: Good and Attention Span: Good  Recall:  Good  Fund of Knowledge:Good  Language: Good  Akathisia:   Negative  Handed:  Right  AIMS (if indicated):  not done  Assets:  Communication Skills Desire for Improvement Housing Physical Health Resilience Talents/Skills  ADL's:  Intact  Cognition: WNL  Sleep:  Fair   Assessment and Plan: 44 yo divorced AAF who comes to establish care hoping to continue medications for episodic anxiety and insomnia. She has a hx of treatment within [place] system and has been dealing with panic type anxiety for "years". She was tried on alprazolam and  What sounds like antidepressants but did not respond to the former and did not wish to take medications on daily basis as her anxiety attacks do not occur daily. She has eventually settled on as needed diazepam 5 mg which worked well on as needed basis. She has been on this medication at least since 2013. This year has been particularly difficult for her: she had three deaths  in her family; she is the sole caregiver for her mother who has Alzheimer's disease. Jacquel also is trying to complete her education at [employer] at the same time. She reports that frequency of panic attacks varies but they have been occurring weekly (not daily). She worries a lot lately and this makes falling asleep more difficult and she would use diazepam for that as well. There is no hx of clinical depression, mania, psychosis. She does not abuse alcohol or street drugs. There is no hx of abuse/trauma.  Dx: Panic disorder  Plan: Patient is not interested in again trying an antidepressant for panic type anxiety. We will continue diazepam 5 mg prn anxiety/occasional insomnia. Risks associated with prolonged use of benzodiazepines reviewed. She is not interested in psychotherapy either. We will meet aThe plan was discussed with patient who had an opportunity to ask questions and these were all answered. I spend 60 minutes in videoconferencing with the patient and devoted approximately 50% of this time to explanation of diagnosis, discussion of treatment options  and med education.gain in 3 months.    Magdalene Patricia, MD 12/15/20208:27 AM

## 2019-04-29 ENCOUNTER — Other Ambulatory Visit: Payer: Self-pay

## 2019-04-29 ENCOUNTER — Ambulatory Visit (INDEPENDENT_AMBULATORY_CARE_PROVIDER_SITE_OTHER): Payer: No Typology Code available for payment source | Admitting: Psychiatry

## 2019-04-29 DIAGNOSIS — F410 Panic disorder [episodic paroxysmal anxiety] without agoraphobia: Secondary | ICD-10-CM

## 2019-04-29 MED ORDER — DIAZEPAM 5 MG PO TABS: 5.0000 mg | ORAL_TABLET | Freq: Two times a day (BID) | ORAL | 3 refills | Status: DC | PRN

## 2019-04-29 NOTE — Progress Notes (Addendum)
BH MD/PA/NP OP Progress Note  04/29/2019 10:13 AM [name]  MRN:  [id number] Interview was conducted using WebEx teleconferencing application and I verified that I was speaking with the correct person using two identifiers. I discussed the limitations of evaluation and management by telemedicine and  the availability of in person appointments. Patient expressed understanding and agreed to proceed. Patient location - home; physician - home office.  Chief Complaint: Anxiety.  HPI: 44 yo divorced AAF who comes to establish care hoping to continue medications for episodic anxiety and insomnia. She has a hx of treatment within [place] system and has been dealing with panic type anxiety for "years". She was tried on alprazolam and what sounds like antidepressants but did not respond to the former and did not wish to take medications on daily basis as her anxiety attacks do not occur daily. She has eventually settled on diazepam 5 mg which worked well on as needed basis. She has been on this medication at least since 2013. This year has been particularly difficult for her: she had three deaths in her family (another one - nice - in late December); she is the sole caregiver for her mother who has Alzheimer's disease. Trinidi also is trying to complete her education at [employer] at the same time. She reports that frequency of panic attacks varies but they have been occurring weekly (not daily). She worries a lot lately and this makes falling asleep more difficult and she would use diazepam for that as well. There is no hx of clinical depression, mania, psychosis. She does not abuse alcohol or street drugs. There is no hx of abuse/trauma. Patient is not interested in again trying an antidepressant for panic type anxiety. She is not interested in psychotherapy either.    Visit Diagnosis:    ICD-10-CM   1. Panic disorder  F41.0     Past Psychiatric History: Please see intake H&P.  Past Medical History:  Past  Medical History:  Diagnosis Date  . Anxiety    No past surgical history on file.  Family Psychiatric History: None.  Family History: No family history on file.  Social History:  Social History   Socioeconomic History  . Marital status: Divorced    Spouse name: Not on file  . Number of children: 0  . Years of education: Not on file  . Highest education level: Not on file  Occupational History  . Occupation: Ship broker  Tobacco Use  . Smoking status: Current Every Day Smoker    Packs/day: 1.50    Types: Cigarettes  . Smokeless tobacco: Never Used  Substance and Sexual Activity  . Alcohol use: No  . Drug use: Never  . Sexual activity: Not on file  Other Topics Concern  . Not on file  Social History Narrative  . Not on file   Social Determinants of Health   Financial Resource Strain:   . Difficulty of Paying Living Expenses:   Food Insecurity:   . Worried About Charity fundraiser in the Last Year:   . Arboriculturist in the Last Year:   Transportation Needs:   . Film/video editor (Medical):   Marland Kitchen Lack of Transportation (Non-Medical):   Physical Activity:   . Days of Exercise per Week:   . Minutes of Exercise per Session:   Stress:   . Feeling of Stress :   Social Connections:   . Frequency of Communication with Friends and Family:   . Frequency of Social  Gatherings with Friends and Family:   . Attends Religious Services:   . Active Member of Clubs or Organizations:   . Attends Banker Meetings:   Marland Kitchen Marital Status:     Allergies: No Known Allergies  Metabolic Disorder Labs: No results found for: HGBA1C, MPG No results found for: PROLACTIN No results found for: CHOL, TRIG, HDL, CHOLHDL, VLDL, LDLCALC No results found for: TSH  Therapeutic Level Labs: No results found for: LITHIUM No results found for: VALPROATE No components found for:  CBMZ  Current Medications: Current Outpatient Medications  Medication Sig Dispense Refill  .  diazepam (VALIUM) 5 MG tablet Take 1 tablet (5 mg total) by mouth 2 (two) times daily as needed for anxiety (sleep). 60 tablet 3   No current facility-administered medications for this visit.    Psychiatric Specialty Exam: Review of Systems  Psychiatric/Behavioral: The patient is nervous/anxious.   All other systems reviewed and are negative.   There were no vitals taken for this visit.There is no height or weight on file to calculate BMI.  General Appearance: Casual and Well Groomed  Eye Contact:  Good  Speech:  Clear and Coherent and Normal Rate  Volume:  Normal  Mood:  Anxious  Affect:  Full Range  Thought Process:  Goal Directed and Linear  Orientation:  Full (Time, Place, and Person)  Thought Content: Logical   Suicidal Thoughts:  No  Homicidal Thoughts:  No  Memory:  Immediate;   Good Recent;   Good Remote;   Good  Judgement:  Good  Insight:  Good  Psychomotor Activity:  Normal  Concentration:  Concentration: Good  Recall:  Good  Fund of Knowledge: Good  Language: Good  Akathisia:  Negative  Handed:  Right  AIMS (if indicated): not done  Assets:  Communication Skills Desire for Improvement Financial Resources/Insurance Housing Physical Health Resilience Talents/Skills Vocational/Educational  ADL's:  Intact  Cognition: WNL  Sleep:  Fair    Assessment and Plan: 44 yo divorced AAF who comes to establish care hoping to continue medications for episodic anxiety and insomnia. She has a hx of treatment within [place] system and has been dealing with panic type anxiety for "years". She was tried on alprazolam and what sounds like antidepressants but did not respond to the former and did not wish to take medications on daily basis as her anxiety attacks do not occur daily. She has eventually settled on diazepam 5 mg which worked well on as needed basis. She has been on this medication at least since 2013. This year has been particularly difficult for her: she had three deaths  in her family (another one - nice - in late December); she is the sole caregiver for her mother who has Alzheimer's disease. [name] also is trying to complete her education at [employer] at the same time. She reports that frequency of panic attacks varies but they have been occurring weekly (not daily). She worries a lot lately and this makes falling asleep more difficult and she would use diazepam for that as well. There is no hx of clinical depression, mania, psychosis. She does not abuse alcohol or street drugs. There is no hx of abuse/trauma. Patient is not interested in again trying an antidepressant for panic type anxiety. She is not interested in psychotherapy either.   Dx: Panic disorder  Plan: We will continue diazepam 5 mg prn anxiety/occasional insomnia. We will meet again in 3 months. The plan was discussed with patient who had an opportunity  to ask questions and these were all answered. I spend 20 minutes in videoconferencing with the patient     Magdalene Patricia, MD 04/29/2019, 10:13 AM

## 2019-08-05 ENCOUNTER — Other Ambulatory Visit: Payer: Self-pay

## 2019-08-05 ENCOUNTER — Telehealth (INDEPENDENT_AMBULATORY_CARE_PROVIDER_SITE_OTHER): Payer: No Typology Code available for payment source | Admitting: Psychiatry

## 2019-08-05 ENCOUNTER — Encounter (HOSPITAL_COMMUNITY): Payer: Self-pay | Admitting: Psychiatry

## 2019-08-05 DIAGNOSIS — F410 Panic disorder [episodic paroxysmal anxiety] without agoraphobia: Secondary | ICD-10-CM

## 2019-08-05 MED ORDER — DIAZEPAM 5 MG PO TABS: 5.0000 mg | ORAL_TABLET | Freq: Two times a day (BID) | ORAL | 3 refills | Status: DC | PRN

## 2019-08-05 NOTE — Progress Notes (Signed)
BH MD/PA/NP OP Progress Note  08/05/2019 1:14 PM [name]  MRN:  [id number] Interview was conducted by phone and I verified that I was speaking with the correct person using two identifiers. I discussed the limitations of evaluation and management by telemedicine and  the availability of in person appointments. Patient expressed understanding and agreed to proceed. Patient location - home; physician - home office.  Chief Complaint: "I am less anxious now".  HPI: 44 yo divorced AAF with episodic anxiety and insomnia. She has a hx of treatment within [place] system and has been dealing with panic type anxiety for "years". She was tried on alprazolam and what sounds like antidepressants but did not respond to the former and did not wish to take medications on daily basis as her anxiety attacks do not occur daily. She has eventually settled on diazepam 5 mg which worked well on as needed basis. She has been on this medication at least since 2013. This year has been particularly difficult for her: she had three deaths in her family (another one - nice - in late December); she is the caregiver for her mother who has Alzheimer's disease but now has some professional help at home. Ashante also is trying to complete her education at [employer] at the same time - will graduate after Summer session. She reports that frequency of panic attacks varies but they have been occurring weekly (not daily). She reports that anxiety subsided some and she does not have panic attacks daily. Still she uses diazepam on occasion for insomnia as well. There is no hx of clinical depression, mania, psychosis. She does not abuse alcohol or street drugs. There is no hx of abuse/trauma. Patient is not interested in again trying an antidepressant for panic type anxiety. She is not interested in psychotherapy either.    Visit Diagnosis:    ICD-10-CM   1. Panic disorder  F41.0     Past Psychiatric History: Please see intake H&P.  Past  Medical History:  Past Medical History:  Diagnosis Date  . Anxiety    History reviewed. No pertinent surgical history.  Family Psychiatric History: Negative  Family History:  Family History  Problem Relation Age of Onset  . Dementia Mother     Social History:  Social History   Socioeconomic History  . Marital status: Divorced    Spouse name: Not on file  . Number of children: 0  . Years of education: Not on file  . Highest education level: Not on file  Occupational History  . Occupation: Consulting civil engineer  Tobacco Use  . Smoking status: Current Every Day Smoker    Packs/day: 1.50    Types: Cigarettes  . Smokeless tobacco: Never Used  Vaping Use  . Vaping Use: Never used  Substance and Sexual Activity  . Alcohol use: No  . Drug use: Never  . Sexual activity: Not on file  Other Topics Concern  . Not on file  Social History Narrative  . Not on file   Social Determinants of Health   Financial Resource Strain:   . Difficulty of Paying Living Expenses:   Food Insecurity:   . Worried About Programme researcher, broadcasting/film/video in the Last Year:   . Barista in the Last Year:   Transportation Needs:   . Freight forwarder (Medical):   Marland Kitchen Lack of Transportation (Non-Medical):   Physical Activity:   . Days of Exercise per Week:   . Minutes of Exercise per Session:  Stress:   . Feeling of Stress :   Social Connections:   . Frequency of Communication with Friends and Family:   . Frequency of Social Gatherings with Friends and Family:   . Attends Religious Services:   . Active Member of Clubs or Organizations:   . Attends Banker Meetings:   Marland Kitchen Marital Status:     Allergies: No Known Allergies  Metabolic Disorder Labs: No results found for: HGBA1C, MPG No results found for: PROLACTIN No results found for: CHOL, TRIG, HDL, CHOLHDL, VLDL, LDLCALC No results found for: TSH  Therapeutic Level Labs: No results found for: LITHIUM No results found for: VALPROATE No  components found for:  CBMZ  Current Medications: Current Outpatient Medications  Medication Sig Dispense Refill  . diazepam (VALIUM) 5 MG tablet Take 1 tablet (5 mg total) by mouth 2 (two) times daily as needed for anxiety (sleep). 60 tablet 3   No current facility-administered medications for this visit.      Psychiatric Specialty Exam: Review of Systems  Psychiatric/Behavioral: The patient is nervous/anxious.   All other systems reviewed and are negative.   There were no vitals taken for this visit.There is no height or weight on file to calculate BMI.  General Appearance: NA  Eye Contact:  NA  Speech:  Clear and Coherent and Normal Rate  Volume:  Normal  Mood:  Episodic anxiety  Affect:  NA  Thought Process:  Goal Directed  Orientation:  Full (Time, Place, and Person)  Thought Content: Logical   Suicidal Thoughts:  No  Homicidal Thoughts:  No  Memory:  Immediate;   Good Recent;   Good Remote;   Good  Judgement:  Good  Insight:  Good  Psychomotor Activity:  NA  Concentration:  Concentration: Good  Recall:  Good  Fund of Knowledge: Good  Language: Good  Akathisia:  Negative  Handed:  Right  AIMS (if indicated): not done  Assets:  Communication Skills Desire for Improvement Financial Resources/Insurance Housing Resilience Vocational/Educational  ADL's:  Intact  Cognition: WNL  Sleep:  Fair    Assessment and Plan: 44 yo divorced AAF with episodic anxiety and insomnia. She has a hx of treatment within [place] system and has been dealing with panic type anxiety for "years". She was tried on alprazolam and what sounds like antidepressants but did not respond to the former and did not wish to take medications on daily basis as her anxiety attacks do not occur daily. She has eventually settled on diazepam 5 mg which worked well on as needed basis. She has been on this medication at least since 2013. This year has been particularly difficult for her: she had three deaths in  her family (another one - nice - in late December); she is the caregiver for her mother who has Alzheimer's disease but now has some professional help at home. [name] also is trying to complete her education at [employer] at the same time - will graduate after Summer session. She reports that frequency of panic attacks varies but they have been occurring weekly (not daily). She reports that anxiety subsided some and she does not have panic attacks daily. Still she uses diazepam on occasion for insomnia as well. There is no hx of clinical depression, mania, psychosis. She does not abuse alcohol or street drugs. There is no hx of abuse/trauma. Patient is not interested in again trying an antidepressant for panic type anxiety.   Dx: Panic disorder  Plan: We will continue diazepam  5 mg prn anxiety/occasional insomnia. We will meet again in 3 months. The plan was discussed with patient who had an opportunity to ask questions and these were all answered. I spend32minutes in phone consultation with the patient     Stephanie Acre, MD 08/05/2019, 1:14 PM

## 2019-11-11 ENCOUNTER — Telehealth (INDEPENDENT_AMBULATORY_CARE_PROVIDER_SITE_OTHER): Payer: No Typology Code available for payment source | Admitting: Psychiatry

## 2019-11-11 ENCOUNTER — Other Ambulatory Visit: Payer: Self-pay

## 2019-11-11 DIAGNOSIS — F410 Panic disorder [episodic paroxysmal anxiety] without agoraphobia: Secondary | ICD-10-CM

## 2019-11-11 MED ORDER — DIAZEPAM 5 MG PO TABS: 5.0000 mg | ORAL_TABLET | Freq: Two times a day (BID) | ORAL | 3 refills | Status: AC | PRN

## 2019-11-11 NOTE — Progress Notes (Signed)
BH MD/PA/NP OP Progress Note  11/11/2019 1:08 PM [name]  MRN:  [id number] Interview was conducted by phone and I verified that I was speaking with the correct person using two identifiers. I discussed the limitations of evaluation and management by telemedicine and  the availability of in person appointments. Patient expressed understanding and agreed to proceed. Patient location - home; physician - home office.  Chief Complaint: Episodic anxiety.  HPI: 44 yo divorced AAF with episodic anxiety and insomnia. She has a hx of treatment within [place] system and has been dealing with panic type anxiety for "years". She was tried on alprazolam andwhat sounds like antidepressants but did not respond to the former and did not wish to take medications on daily basis as her anxiety attacks do not occur daily. She has eventually settled on diazepam 5 mg which worked well on as needed basis. She has been on this medication at least since 2013. 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 Valincia complete her education at [employer] and will get her certificate in December. She reports that frequency of panic attacks varies but they have been occurring weekly (not daily). She reports that anxiety subsided some and she does not have panic attacks daily. Still she uses diazepam on occasion for insomnia as well. There is no hx of clinical depression, mania, psychosis. She does not abuse alcohol or street drugs. Patient is not interested in again trying an antidepressant for panic type anxiety.    Visit Diagnosis:    ICD-10-CM   1. Panic disorder  F41.0     Past Psychiatric History: Please see intake H&P.  Past Medical History:  Past Medical History:  Diagnosis Date  . Anxiety    No past surgical history on file.  Family  Psychiatric History: Reviewed.  Family History:  Family History  Problem Relation Age of Onset  . Dementia Mother     Social History:  Social History   Socioeconomic History  . Marital status: Divorced    Spouse name: Not on file  . Number of children: 0  . Years of education: Not on file  . Highest education level: Not on file  Occupational History  . Occupation: Consulting civil engineer  Tobacco Use  . Smoking status: Current Every Day Smoker    Packs/day: 1.50    Types: Cigarettes  . Smokeless tobacco: Never Used  Vaping Use  . Vaping Use: Never used  Substance and Sexual Activity  . Alcohol use: No  . Drug use: Never  . Sexual activity: Not on file  Other Topics Concern  . Not on file  Social History Narrative  . Not on file   Social Determinants of Health   Financial Resource Strain:   . Difficulty of Paying Living Expenses: Not on file  Food Insecurity:   . Worried About Programme researcher, broadcasting/film/video in the Last Year: Not on file  . Ran Out of Food in the Last Year: Not on file  Transportation Needs:   . Lack of Transportation (Medical): Not on file  . Lack of Transportation (Non-Medical): Not on file  Physical Activity:   . Days of Exercise per Week: Not on file  . Minutes of Exercise per Session: Not on file  Stress:   . Feeling of Stress : Not on  file  Social Connections:   . Frequency of Communication with Friends and Family: Not on file  . Frequency of Social Gatherings with Friends and Family: Not on file  . Attends Religious Services: Not on file  . Active Member of Clubs or Organizations: Not on file  . Attends Banker Meetings: Not on file  . Marital Status: Not on file    Allergies: No Known Allergies  Metabolic Disorder Labs: No results found for: HGBA1C, MPG No results found for: PROLACTIN No results found for: CHOL, TRIG, HDL, CHOLHDL, VLDL, LDLCALC No results found for: TSH  Therapeutic Level Labs: No results found for: LITHIUM No results  found for: VALPROATE No components found for:  CBMZ  Current Medications: Current Outpatient Medications  Medication Sig Dispense Refill  . diazepam (VALIUM) 5 MG tablet Take 1 tablet (5 mg total) by mouth 2 (two) times daily as needed for anxiety (sleep). 45 tablet 3   No current facility-administered medications for this visit.     Psychiatric Specialty Exam: Review of Systems  All other systems reviewed and are negative.   There were no vitals taken for this visit.There is no height or weight on file to calculate BMI.  General Appearance: NA  Eye Contact:  NA  Speech:  Clear and Coherent and Normal Rate  Volume:  Normal  Mood:  Episodic anxiety.  Affect:  NA  Thought Process:  Goal Directed and Linear  Orientation:  Full (Time, Place, and Person)  Thought Content: Logical   Suicidal Thoughts:  No  Homicidal Thoughts:  No  Memory:  Immediate;   Good Recent;   Good Remote;   Good  Judgement:  Good  Insight:  Good  Psychomotor Activity:  NA  Concentration:  Concentration: Good  Recall:  Good  Fund of Knowledge: Good  Language: Good  Akathisia:  Negative  Handed:  Right  AIMS (if indicated): not done  Assets:  Communication Skills Desire for Improvement  ADL's:  Intact  Cognition: WNL  Sleep:  Good    Assessment and Plan: 44 yo divorced AAF with episodic anxiety and insomnia. She has a hx of treatment within [place] system and has been dealing with panic type anxiety for "years". She was tried on alprazolam andwhat sounds like antidepressants but did not respond to the former and did not wish to take medications on daily basis as her anxiety attacks do not occur daily. She has eventually settled on diazepam 5 mg which worked well on as needed basis. She has been on this medication at least since 2013. This year has been particularly difficult for her: she had three deaths in her family(another one - nice - in late December); she is the caregiver for her mother who has  Alzheimer's disease but now has some professional help at home. Ciela complete her education at [employer] and will get her certificate in December. She reports that frequency of panic attacks varies but they have been occurring weekly (not daily). She reports that anxiety subsided some and she does not have panic attacks daily. Still she uses diazepam on occasion for insomnia as well. There is no hx of clinical depression, mania, psychosis. She does not abuse alcohol or street drugs. Patient is not interested in again trying an antidepressant for panic type anxiety.   Dx: Panic disorder  Plan: We will continue diazepam 5 mg prn anxiety/occasional insomnia. We will meet again in 3 months.The plan was discussed with patient who had an opportunity to  ask questions and these were all answered. I spend77minutes in phone consultation with the patient    Magdalene Patricia, MD 11/11/2019, 1:08 PM

## 2020-02-10 ENCOUNTER — Other Ambulatory Visit: Payer: Self-pay

## 2020-02-10 ENCOUNTER — Telehealth (INDEPENDENT_AMBULATORY_CARE_PROVIDER_SITE_OTHER): Payer: Self-pay | Admitting: Psychiatry

## 2020-02-10 DIAGNOSIS — F410 Panic disorder [episodic paroxysmal anxiety] without agoraphobia: Secondary | ICD-10-CM

## 2020-02-10 NOTE — Progress Notes (Signed)
BH MD/PA/NP OP Progress Note  02/10/2020 1:13 PM [name]  MRN:  [id number] Interview was conducted by phone and I verified that I was speaking with the correct person using two identifiers. I discussed the limitations of evaluation and management by telemedicine and  the availability of in person appointments. Patient expressed understanding and agreed to proceed. Participants in the visit: patient (location - home); physician (location - home office).  Chief Complaint: Episodic anxiety.  HPI: 44yo divorced AAF withepisodic anxiety and insomnia. She has a hx of treatment within [place] system and has been dealing with panic type anxiety for "years". She was tried on alprazolam andwhat sounds like antidepressants but did not respond to the former and did not wish to take medications on daily basis as her anxiety attacks do not occur daily. She has eventually settled on diazepam 5 mg which worked well on as needed basis. She has been on this medication at least since 2013. She is the caregiver for her mother who has Alzheimer's diseasebut now has some professional help at home. Florella completed her education at [employer] in December. She reports that frequency of panic attacks varies but they have been occurring weekly (not daily). Still she uses diazepam on occasion for insomnia as well.There is no hx of clinical depression, mania, psychosis. She does not abuse alcohol or street drugs.    Visit Diagnosis:    ICD-10-CM   1. Panic disorder  F41.0     Past Psychiatric History: Please see intake H&P.  Past Medical History:  Past Medical History:  Diagnosis Date  . Anxiety    No past surgical history on file.  Family Psychiatric History: Reviewed.  Family History:  Family History  Problem Relation Age of Onset  . Dementia Mother     Social History:  Social History   Socioeconomic History  . Marital status: Divorced    Spouse name: Not on file  . Number of children: 0  . Years of  education: Not on file  . Highest education level: Not on file  Occupational History  . Occupation: Consulting civil engineer  Tobacco Use  . Smoking status: Current Every Day Smoker    Packs/day: 1.50    Types: Cigarettes  . Smokeless tobacco: Never Used  Vaping Use  . Vaping Use: Never used  Substance and Sexual Activity  . Alcohol use: No  . Drug use: Never  . Sexual activity: Not on file  Other Topics Concern  . Not on file  Social History Narrative  . Not on file   Social Determinants of Health   Financial Resource Strain: Not on file  Food Insecurity: Not on file  Transportation Needs: Not on file  Physical Activity: Not on file  Stress: Not on file  Social Connections: Not on file    Allergies: No Known Allergies  Metabolic Disorder Labs: No results found for: HGBA1C, MPG No results found for: PROLACTIN No results found for: CHOL, TRIG, HDL, CHOLHDL, VLDL, LDLCALC No results found for: TSH  Therapeutic Level Labs: No results found for: LITHIUM No results found for: VALPROATE No components found for:  CBMZ  Current Medications: Current Outpatient Medications  Medication Sig Dispense Refill  . diazepam (VALIUM) 5 MG tablet Take 1 tablet (5 mg total) by mouth 2 (two) times daily as needed for anxiety (sleep). 45 tablet 3   No current facility-administered medications for this visit.    Psychiatric Specialty Exam: Review of Systems  Psychiatric/Behavioral: The patient is nervous/anxious.  All other systems reviewed and are negative.   There were no vitals taken for this visit.There is no height or weight on file to calculate BMI.  General Appearance: NA  Eye Contact:  NA  Speech:  Clear and Coherent and Normal Rate  Volume:  Normal  Mood:  Euthymic  Affect:  NA  Thought Process:  Goal Directed  Orientation:  Full (Time, Place, and Person)  Thought Content: Logical   Suicidal Thoughts:  No  Homicidal Thoughts:  No  Memory:  Immediate;   Good Recent;    Good Remote;   Good  Judgement:  Good  Insight:  Good  Psychomotor Activity:  NA  Concentration:  Concentration: Good  Recall:  Good  Fund of Knowledge: Good  Language: Good  Akathisia:  Negative  Handed:  Right  AIMS (if indicated): not done  Assets:  Communication Skills Desire for Improvement Housing Physical Health Resilience Vocational/Educational  ADL's:  Intact  Cognition: WNL  Sleep:  Good    Assessment and Plan: 44yo divorced AAF withepisodic anxiety and insomnia. She has a hx of treatment within [place] system and has been dealing with panic type anxiety for "years". She was tried on alprazolam andwhat sounds like antidepressants but did not respond to the former and did not wish to take medications on daily basis as her anxiety attacks do not occur daily. She has eventually settled on diazepam 5 mg which worked well on as needed basis. She has been on this medication at least since 2013. She is the caregiver for her mother who has Alzheimer's diseasebut now has some professional help at home. [name] completed her education at [employer] in December. She reports that frequency of panic attacks varies but they have been occurring weekly (not daily). Still she uses diazepam on occasion for insomnia as well.There is no hx of clinical depression, mania, psychosis. She does not abuse alcohol or street drugs.   Dx: Panic disorder  Plan: We will continue diazepam 5 mg prn anxiety/occasional insomnia. We will meet again in 3 months.The plan was discussed with patient who had an opportunity to ask questions and these were all answered. I spend34minutes inphone consultationwith the patient.   Magdalene Patricia, MD 02/10/2020, 1:13 PM

## 2020-03-23 ENCOUNTER — Other Ambulatory Visit (HOSPITAL_COMMUNITY): Payer: Self-pay | Admitting: Psychiatry

## 2020-04-28 ENCOUNTER — Other Ambulatory Visit: Payer: Self-pay

## 2020-04-28 ENCOUNTER — Telehealth (INDEPENDENT_AMBULATORY_CARE_PROVIDER_SITE_OTHER): Payer: Self-pay | Admitting: Psychiatry

## 2020-04-28 DIAGNOSIS — F410 Panic disorder [episodic paroxysmal anxiety] without agoraphobia: Secondary | ICD-10-CM

## 2020-04-28 MED ORDER — DIAZEPAM 5 MG PO TABS: 5.0000 mg | ORAL_TABLET | Freq: Two times a day (BID) | ORAL | 5 refills | Status: AC | PRN

## 2020-04-28 NOTE — Progress Notes (Signed)
BH MD/PA/NP OP Progress Note  04/28/2020 1:16 PM [name]  MRN:  [id number] Interview was conducted by phone and I verified that I was speaking with the correct person using two identifiers. I discussed the limitations of evaluation and management by telemedicine and  the availability of in person appointments. Patient expressed understanding and agreed to proceed. Participants in the visit: patient (location - home); physician (location - home office).  Chief Complaint: Anxiety.  HPI: 45yo divorced AAF withepisodic anxiety and insomnia. She has a hx of treatment within [place] system and has been dealing with panic type anxiety for "years". She was tried on alprazolam andwhat sounds like antidepressants but did not respond to the former and did not wish to take medications on daily basis as her anxiety attacks do not occur daily. She has eventually settled on diazepam 5 mg which worked well on as needed basis. She has been on this medication at least since 2013. She is the caregiver for her mother who has Alzheimer's diseasebut now has some professional help at home. [name] completed her education at [employer] in December.She reports that frequency of panic attacks varies but they have been more frequent recently since her dx and treatment for retinal detachment in left eye. Still she uses diazepam on occasion for insomnia as well.There is no hx of clinical depression, mania, psychosis. She does not abuse alcohol or street drugs.    Visit Diagnosis:    ICD-10-CM   1. Panic disorder  F41.0     Past Psychiatric History: Please see intake H&P.  Past Medical History:  Past Medical History:  Diagnosis Date  . Anxiety    No past surgical history on file.  Family Psychiatric History: Reviewed.  Family History:  Family History  Problem Relation Age of Onset  . Dementia Mother     Social History:  Social History   Socioeconomic History  . Marital status: Divorced    Spouse name: Not on  file  . Number of children: 0  . Years of education: Not on file  . Highest education level: Not on file  Occupational History  . Occupation: Consulting civil engineer  Tobacco Use  . Smoking status: Current Every Day Smoker    Packs/day: 1.50    Types: Cigarettes  . Smokeless tobacco: Never Used  Vaping Use  . Vaping Use: Never used  Substance and Sexual Activity  . Alcohol use: No  . Drug use: Never  . Sexual activity: Not on file  Other Topics Concern  . Not on file  Social History Narrative  . Not on file   Social Determinants of Health   Financial Resource Strain: Not on file  Food Insecurity: Not on file  Transportation Needs: Not on file  Physical Activity: Not on file  Stress: Not on file  Social Connections: Not on file    Allergies: No Known Allergies  Metabolic Disorder Labs: No results found for: HGBA1C, MPG No results found for: PROLACTIN No results found for: CHOL, TRIG, HDL, CHOLHDL, VLDL, LDLCALC No results found for: TSH  Therapeutic Level Labs: No results found for: LITHIUM No results found for: VALPROATE No components found for:  CBMZ  Current Medications: Current Outpatient Medications  Medication Sig Dispense Refill  . diazepam (VALIUM) 5 MG tablet Take 1 tablet (5 mg total) by mouth 2 (two) times daily as needed for anxiety. 60 tablet 5   No current facility-administered medications for this visit.      Psychiatric Specialty Exam: Review of  Systems  Eyes: Positive for visual disturbance.  Psychiatric/Behavioral: The patient is nervous/anxious.   All other systems reviewed and are negative.   There were no vitals taken for this visit.There is no height or weight on file to calculate BMI.  General Appearance: NA  Eye Contact:  NA  Speech:  Clear and Coherent and Normal Rate  Volume:  Normal  Mood:  Anxious  Affect:  NA  Thought Process:  Goal Directed and Linear  Orientation:  Full (Time, Place, and Person)  Thought Content: Logical   Suicidal  Thoughts:  No  Homicidal Thoughts:  No  Memory:  Immediate;   Good Recent;   Good Remote;   Good  Judgement:  Good  Insight:  Good  Psychomotor Activity:  NA  Concentration:  Concentration: Good  Recall:  Good  Fund of Knowledge: Good  Language: Good  Akathisia:  Negative  Handed:  Right  AIMS (if indicated): not done  Assets:  Communication Skills Desire for Improvement Financial Resources/Insurance Housing Resilience Talents/Skills  ADL's:  Intact  Cognition: WNL  Sleep:  Fair    Assessment and Plan: 45yo divorced AAF withepisodic anxiety and insomnia. She has a hx of treatment within [place] system and has been dealing with panic type anxiety for "years". She was tried on alprazolam andwhat sounds like antidepressants but did not respond to the former and did not wish to take medications on daily basis as her anxiety attacks do not occur daily. She has eventually settled on diazepam 5 mg which worked well on as needed basis. She has been on this medication at least since 2013. She is the caregiver for her mother who has Alzheimer's diseasebut now has some professional help at home. [name] completed her education at [employer] in December.She reports that frequency of panic attacks varies but they have been more frequent recently since her dx and treatment for retinal detachment in left eye. Still she uses diazepam on occasion for insomnia as well.There is no hx of clinical depression, mania, psychosis. She does not abuse alcohol or street drugs.   Dx: Panic disorder  Plan: We will continue diazepam 5 mg prn anxiety/occasional insomnia. Next appointment in 4 months or prn. The plan was discussed with patient who had an opportunity to ask questions and these were all answered. I spend22minutes inphone consultationwith the patient.   Magdalene Patricia, MD 04/28/2020, 1:16 PM

## 2022-02-21 ENCOUNTER — Other Ambulatory Visit: Payer: Self-pay

## 2022-02-21 ENCOUNTER — Emergency Department (HOSPITAL_COMMUNITY)
Admission: EM | Admit: 2022-02-21 | Discharge: 2022-02-22 | Disposition: A | Discharge status: Home / Self Care | Payer: No Typology Code available for payment source | Attending: Emergency Medicine | Admitting: Emergency Medicine

## 2022-02-21 ENCOUNTER — Emergency Department (HOSPITAL_COMMUNITY): Payer: No Typology Code available for payment source

## 2022-02-21 ENCOUNTER — Encounter (HOSPITAL_COMMUNITY): Payer: Self-pay

## 2022-02-21 DIAGNOSIS — M5412 Radiculopathy, cervical region: Secondary | ICD-10-CM

## 2022-02-21 DIAGNOSIS — I10 Essential (primary) hypertension: Secondary | ICD-10-CM | POA: Diagnosis not present

## 2022-02-21 DIAGNOSIS — M62830 Muscle spasm of back: Secondary | ICD-10-CM

## 2022-02-21 DIAGNOSIS — M79602 Pain in left arm: Secondary | ICD-10-CM | POA: Diagnosis present

## 2022-02-21 HISTORY — DX: Hyperlipidemia, unspecified: E78.5

## 2022-02-21 HISTORY — DX: Essential (primary) hypertension: I10

## 2022-02-21 LAB — CBC
HCT: 43.9 % (ref 36.0–46.0)
Hemoglobin: 14.5 g/dL (ref 12.0–15.0)
MCH: 30.7 pg (ref 26.0–34.0)
MCHC: 33 g/dL (ref 30.0–36.0)
MCV: 93 fL (ref 80.0–100.0)
Platelets: 235 10*3/uL (ref 150–400)
RBC: 4.72 MIL/uL (ref 3.87–5.11)
RDW: 13.3 % (ref 11.5–15.5)
WBC: 5.8 10*3/uL (ref 4.0–10.5)
nRBC: 0 % (ref 0.0–0.2)

## 2022-02-21 LAB — BASIC METABOLIC PANEL
Anion gap: 9 (ref 5–15)
BUN: 19 mg/dL (ref 6–20)
CO2: 21 mmol/L — ABNORMAL LOW (ref 22–32)
Calcium: 9.3 mg/dL (ref 8.9–10.3)
Chloride: 107 mmol/L (ref 98–111)
Creatinine, Ser: 0.53 mg/dL (ref 0.44–1.00)
GFR, Estimated: >60 mL/min (ref >60)
Glucose, Bld: 101 mg/dL — ABNORMAL HIGH (ref 70–99)
Potassium: 3.8 mmol/L (ref 3.5–5.1)
Sodium: 137 mmol/L (ref 135–145)

## 2022-02-21 LAB — TROPONIN I (HIGH SENSITIVITY): Troponin I (High Sensitivity): <2 ng/L (ref <18)

## 2022-02-21 NOTE — ED Provider Triage Note (Signed)
Emergency Medicine Provider Triage Evaluation Note  ACELYN BASHAM , a 47 y.o. female  was evaluated in triage.  Pt complains of left arm pain and neck stiffness that has been going on for 7 days.  Patient also reports chest pain that started yesterday states the pain is very mild.  Patient reports returning from a trip that she had to carry heavy luggage.  Denies any injury to her neck or arm..  No fever, nausea, vomiting, shortness of breath.  Review of Systems  Positive: As above Negative: As above  Physical Exam  BP 129/86   Pulse 84   Temp 97.7 F (36.5 C)   Resp 14   Ht 5\' 5"  (1.651 m)   Wt 72.6 kg   SpO2 98%   BMI 26.63 kg/m  Gen:   Awake, no distress   Resp:  Normal effort  MSK:   Moves extremities without difficulty  Other:    Medical Decision Making  Medically screening exam initiated at 7:30 PM.  Appropriate orders placed.  Altamease Oiler Yabut was informed that the remainder of the evaluation will be completed by another provider, this initial triage assessment does not replace that evaluation, and the importance of remaining in the ED until their evaluation is complete.     Rex Kras, [place] 02/22/22 0107

## 2022-02-21 NOTE — ED Provider Notes (Signed)
[facility]-EMERGENCY DEPT Provider Note  CSN: [id number] Arrival date & time: 02/21/22 1739  Chief Complaint(s) Arm Pain  HPI [name] is a 47 y.o. female    The history is provided by the patient.  Arm Pain This is a new problem. Episode onset: 6 days. The problem occurs constantly. The problem has not changed since onset.Associated symptoms include chest pain (intermittent, but unrelated to left arm pain. attributes it to her smoking. CP is brief, sharp, nonexertional. no CP currently). Pertinent negatives include no abdominal pain and no shortness of breath. Nothing aggravates the symptoms. Nothing relieves the symptoms. She has tried nothing for the symptoms.    Past Medical History Past Medical History:  Diagnosis Date   Anxiety    HTN (hypertension)    Hyperlipidemia    Patient Active Problem List   Diagnosis Date Noted   Panic disorder 01/27/2019   Home Medication(s) Prior to Admission medications   Not on File                                                                                                                                    Allergies Patient has no known allergies.  Review of Systems Review of Systems  Respiratory:  Negative for shortness of breath.   Cardiovascular:  Positive for chest pain (intermittent, but unrelated to left arm pain. attributes it to her smoking. CP is brief, sharp, nonexertional. no CP currently).  Gastrointestinal:  Negative for abdominal pain.   As noted in HPI  Physical Exam Vital Signs  I have reviewed the triage vital signs BP 129/86   Pulse 84   Temp 97.7 F (36.5 C)   Resp 14   Ht 5\' 5"  (1.651 m)   Wt 72.6 kg   SpO2 98%   BMI 26.63 kg/m   Physical Exam Vitals reviewed.  Constitutional:      General: She is not in acute distress.    Appearance: She is well-developed. She is not diaphoretic.  HENT:     Head: Normocephalic and atraumatic.     Nose: Nose normal.  Eyes:      General: No scleral icterus.       Right eye: No discharge.        Left eye: No discharge.     Conjunctiva/sclera: Conjunctivae normal.     Pupils: Pupils are equal, round, and reactive to light.  Cardiovascular:     Rate and Rhythm: Normal rate and regular rhythm.     Heart sounds: No murmur heard.    No friction rub. No gallop.  Pulmonary:     Effort: Pulmonary effort is normal. No respiratory distress.     Breath sounds: Normal breath sounds. No stridor. No rales.  Abdominal:     General: There is no distension.     Palpations: Abdomen is soft.     Tenderness: There is no abdominal tenderness.  Musculoskeletal:     Right upper arm: No swelling or tenderness.     Left upper arm: No swelling or tenderness.     Right hand: Normal strength. Normal sensation. Normal pulse.     Left hand: Normal strength. Normal sensation. Normal pulse.     Cervical back: Normal range of motion and neck supple. Spasms and tenderness present. No bony tenderness.     Thoracic back: Spasms and tenderness present. No bony tenderness.       Back:  Skin:    General: Skin is warm and dry.     Findings: No erythema or rash.  Neurological:     Mental Status: She is alert and oriented to person, place, and time.     ED Results and Treatments Labs (all labs ordered are listed, but only abnormal results are displayed) Labs Reviewed  BASIC METABOLIC PANEL - Abnormal; Notable for the following components:      Result Value   CO2 21 (*)    Glucose, Bld 101 (*)    All other components within normal limits  CBC  TROPONIN I (HIGH SENSITIVITY)                                                                                                                         EKG  EKG Interpretation  Date/Time:  Wednesday February 21 2022 19:03:55 EST Ventricular Rate:  84 PR Interval:  119 QRS Duration: 76 QT Interval:  384 QTC Calculation: 454 R Axis:   21 Text Interpretation: Sinus rhythm Borderline short PR  interval Low voltage, precordial leads No acute changes Confirmed by Addison Lank [phone]) on 02/21/2022 11:44:21 PM       Radiology DG Cervical Spine Complete  Result Date: 02/21/2022 CLINICAL DATA:  Neck pain EXAM: CERVICAL SPINE - COMPLETE 4+ VIEW COMPARISON:  12/17/2003 FINDINGS: No recent fracture is seen. There is interval appearance of moderate degenerative changes at C5-C6 level. There is no significant encroachment of neural foramina in the oblique views. Prevertebral soft tissues are unremarkable. IMPRESSION: No recent fracture or dislocation is seen. Degenerative changes are noted at C5-C6 level. Electronically Signed   By: Elmer Picker M.D.   On: 02/21/2022 19:58   DG Chest 2 View  Result Date: 02/21/2022 CLINICAL DATA:  Chest pain EXAM: CHEST - 2 VIEW COMPARISON:  12/02/2018 FINDINGS: The heart size and mediastinal contours are within normal limits. Both lungs are clear. The visualized skeletal structures are unremarkable. IMPRESSION: No active cardiopulmonary disease. Electronically Signed   By: Elmer Picker M.D.   On: 02/21/2022 19:56    Medications Ordered in ED Medications - No data to display  Procedures Procedures  (including critical care time)  Medical Decision Making / ED Course   Medical Decision Making Amount and/or Complexity of Data Reviewed Labs: ordered. Decision-making details documented in ED Course. Radiology: ordered and independent interpretation performed. Decision-making details documented in ED Course. ECG/medicine tests: ordered and independent interpretation performed. Decision-making details documented in ED Course.   Left arm pain.  Atraumatic. On exam noted to have muscle spasms of the upper and mid back bilaterally but left greater than right.  Palpating the spastic musculatures does reproduce her pain  and radiation. Differential includes but not limited to muscle strain/spasm causing nerve impingement versus radiculopathy from nerve compression at the vertebrae.  I have low suspicion for cervical stenosis Plain film of the cervical spine was obtained during the MSE process and showed degenerative changes in C5-C6, which maybe a contributing factor.  Chest pain is atypical and unrelated to her left arm pain. The patient was seen in the MSE process and had cardiac workup obtained which was reassuring with an EKG that did not reveal any acute ischemic changes, dysrhythmias, blocks or evidence of pericarditis. Troponin was negative.  Do not feel that additional cardiac enzymes are necessary in this clinical picture. Chest x-ray without evidence of pneumonia, pneumothorax, pulmonary edema.  Additional labs included CBC without leukocytosis or anemia. Metabolic panel without significant electrolyte derangements or renal sufficiency.  Patient offered pain medicine but she declined.  For the muscular strain/spasm, supportive management recommended.     Final Clinical Impression(s) / ED Diagnoses Final diagnoses:  Muscle spasm of back  Cervical radiculopathy   The patient appears reasonably screened and/or stabilized for discharge and I doubt any other medical condition or other [facility] requiring further screening, evaluation, or treatment in the ED at this time. I have discussed the findings, Dx and Tx plan with the patient/family who expressed understanding and agree(s) with the plan. Discharge instructions discussed at length. The patient/family was given strict return precautions who verbalized understanding of the instructions. No further questions at time of discharge.  Disposition: Discharge  Condition: Good  ED Discharge Orders     None       Follow Up: Darnelle Catalan, PA [address] [place] [address] [phone]  Call  to schedule an appointment for close follow  up           This chart was dictated using voice recognition software.  Despite best efforts to proofread,  errors can occur which can change the documentation meaning.    Fatima Blank, MD 02/21/22 2356

## 2022-02-21 NOTE — Discharge Instructions (Addendum)
You may use over-the-counter Motrin (Ibuprofen), Acetaminophen (Tylenol), topical muscle creams such as SalonPas, Icy Hot, Bengay, etc. Please stretch, apply ice or heat (whichever helps), and have massage therapy for additional assistance.  

## 2022-02-21 NOTE — ED Notes (Signed)
Notified the providers that the patient is wanting to leave AMA, if she is unable to see a provider soon

## 2022-02-21 NOTE — ED Triage Notes (Signed)
Pt reports with tightness in her left arm that goes to her neck when she inhales. Pt states that this got bad over the weekend.
